# Patient Record
Sex: Female | Born: 1950 | Race: White | Hispanic: No | Marital: Married
Health system: Southern US, Community
[De-identification: ages and names within clinical notes are randomized; demographics above are authoritative.]

---

## 1999-01-05 ENCOUNTER — Other Ambulatory Visit: Admission: RE | Admit: 1999-01-05 | Discharge: 1999-01-05 | Payer: Self-pay | Admitting: Family Medicine

## 2000-02-19 ENCOUNTER — Encounter: Admission: RE | Admit: 2000-02-19 | Discharge: 2000-02-19 | Payer: Self-pay | Admitting: Family Medicine

## 2000-02-19 ENCOUNTER — Encounter: Payer: Self-pay | Admitting: Family Medicine

## 2001-02-04 ENCOUNTER — Other Ambulatory Visit: Admission: RE | Admit: 2001-02-04 | Discharge: 2001-02-04 | Payer: Self-pay | Admitting: Family Medicine

## 2001-06-30 ENCOUNTER — Encounter: Admission: RE | Admit: 2001-06-30 | Discharge: 2001-06-30 | Payer: Self-pay | Admitting: Family Medicine

## 2001-06-30 ENCOUNTER — Encounter: Payer: Self-pay | Admitting: Family Medicine

## 2002-07-27 ENCOUNTER — Other Ambulatory Visit: Admission: RE | Admit: 2002-07-27 | Discharge: 2002-07-27 | Payer: Self-pay | Admitting: Family Medicine

## 2003-07-29 ENCOUNTER — Other Ambulatory Visit: Admission: RE | Admit: 2003-07-29 | Discharge: 2003-07-29 | Payer: Self-pay | Admitting: Family Medicine

## 2004-01-03 ENCOUNTER — Encounter: Admission: RE | Admit: 2004-01-03 | Discharge: 2004-01-03 | Payer: Self-pay | Admitting: Family Medicine

## 2005-01-22 ENCOUNTER — Other Ambulatory Visit: Admission: RE | Admit: 2005-01-22 | Discharge: 2005-01-22 | Payer: Self-pay | Admitting: Family Medicine

## 2006-04-23 ENCOUNTER — Other Ambulatory Visit: Admission: RE | Admit: 2006-04-23 | Discharge: 2006-04-23 | Payer: Self-pay | Admitting: Family Medicine

## 2006-10-14 ENCOUNTER — Encounter: Admission: RE | Admit: 2006-10-14 | Discharge: 2006-10-14 | Payer: Self-pay | Admitting: Family Medicine

## 2006-10-23 ENCOUNTER — Encounter: Admission: RE | Admit: 2006-10-23 | Discharge: 2006-10-23 | Payer: Self-pay | Admitting: Family Medicine

## 2011-01-20 ENCOUNTER — Encounter: Payer: Self-pay | Admitting: Family Medicine

## 2013-06-10 ENCOUNTER — Other Ambulatory Visit: Payer: Self-pay

## 2013-06-10 DIAGNOSIS — Z1231 Encounter for screening mammogram for malignant neoplasm of breast: Secondary | ICD-10-CM

## 2013-07-08 ENCOUNTER — Ambulatory Visit
Admission: RE | Admit: 2013-07-08 | Discharge: 2013-07-08 | Disposition: A | Discharge status: Home / Self Care | Payer: BC Managed Care – PPO | Source: Ambulatory Visit

## 2013-07-08 DIAGNOSIS — Z1231 Encounter for screening mammogram for malignant neoplasm of breast: Secondary | ICD-10-CM

## 2014-07-21 ENCOUNTER — Other Ambulatory Visit: Payer: Self-pay

## 2014-07-21 DIAGNOSIS — Z1231 Encounter for screening mammogram for malignant neoplasm of breast: Secondary | ICD-10-CM

## 2014-08-05 ENCOUNTER — Ambulatory Visit
Admission: RE | Admit: 2014-08-05 | Discharge: 2014-08-05 | Disposition: A | Discharge status: Home / Self Care | Payer: BC Managed Care – PPO | Source: Ambulatory Visit

## 2014-08-05 DIAGNOSIS — Z1231 Encounter for screening mammogram for malignant neoplasm of breast: Secondary | ICD-10-CM

## 2014-10-12 DIAGNOSIS — Z23 Encounter for immunization: Secondary | ICD-10-CM | POA: Diagnosis not present

## 2014-10-12 DIAGNOSIS — E785 Hyperlipidemia, unspecified: Secondary | ICD-10-CM | POA: Diagnosis not present

## 2014-10-12 DIAGNOSIS — E119 Type 2 diabetes mellitus without complications: Secondary | ICD-10-CM | POA: Diagnosis not present

## 2014-10-12 DIAGNOSIS — I10 Essential (primary) hypertension: Secondary | ICD-10-CM | POA: Diagnosis not present

## 2014-10-19 DIAGNOSIS — Z6829 Body mass index (BMI) 29.0-29.9, adult: Secondary | ICD-10-CM | POA: Diagnosis not present

## 2014-10-19 DIAGNOSIS — I10 Essential (primary) hypertension: Secondary | ICD-10-CM | POA: Diagnosis not present

## 2014-10-19 DIAGNOSIS — E785 Hyperlipidemia, unspecified: Secondary | ICD-10-CM | POA: Diagnosis not present

## 2014-10-19 DIAGNOSIS — E119 Type 2 diabetes mellitus without complications: Secondary | ICD-10-CM | POA: Diagnosis not present

## 2015-02-11 DIAGNOSIS — I10 Essential (primary) hypertension: Secondary | ICD-10-CM | POA: Diagnosis not present

## 2015-02-11 DIAGNOSIS — E785 Hyperlipidemia, unspecified: Secondary | ICD-10-CM | POA: Diagnosis not present

## 2015-02-11 DIAGNOSIS — E119 Type 2 diabetes mellitus without complications: Secondary | ICD-10-CM | POA: Diagnosis not present

## 2015-02-18 DIAGNOSIS — E119 Type 2 diabetes mellitus without complications: Secondary | ICD-10-CM | POA: Diagnosis not present

## 2015-02-18 DIAGNOSIS — E1169 Type 2 diabetes mellitus with other specified complication: Secondary | ICD-10-CM | POA: Diagnosis not present

## 2015-02-18 DIAGNOSIS — I10 Essential (primary) hypertension: Secondary | ICD-10-CM | POA: Diagnosis not present

## 2015-02-18 DIAGNOSIS — E785 Hyperlipidemia, unspecified: Secondary | ICD-10-CM | POA: Diagnosis not present

## 2015-03-05 DIAGNOSIS — Z681 Body mass index (BMI) 19 or less, adult: Secondary | ICD-10-CM | POA: Diagnosis not present

## 2015-03-05 DIAGNOSIS — Z87891 Personal history of nicotine dependence: Secondary | ICD-10-CM | POA: Diagnosis not present

## 2015-03-05 DIAGNOSIS — S81851A Open bite, right lower leg, initial encounter: Secondary | ICD-10-CM | POA: Diagnosis not present

## 2015-03-05 DIAGNOSIS — I10 Essential (primary) hypertension: Secondary | ICD-10-CM | POA: Diagnosis not present

## 2015-03-05 DIAGNOSIS — J449 Chronic obstructive pulmonary disease, unspecified: Secondary | ICD-10-CM | POA: Diagnosis not present

## 2015-03-05 DIAGNOSIS — Z23 Encounter for immunization: Secondary | ICD-10-CM | POA: Diagnosis not present

## 2015-03-05 DIAGNOSIS — F419 Anxiety disorder, unspecified: Secondary | ICD-10-CM | POA: Diagnosis not present

## 2015-03-05 DIAGNOSIS — W540XXA Bitten by dog, initial encounter: Secondary | ICD-10-CM | POA: Diagnosis not present

## 2015-03-05 DIAGNOSIS — S81852A Open bite, left lower leg, initial encounter: Secondary | ICD-10-CM | POA: Diagnosis not present

## 2015-03-09 DIAGNOSIS — Z6825 Body mass index (BMI) 25.0-25.9, adult: Secondary | ICD-10-CM | POA: Diagnosis not present

## 2015-03-09 DIAGNOSIS — W540XXA Bitten by dog, initial encounter: Secondary | ICD-10-CM | POA: Diagnosis not present

## 2015-03-09 DIAGNOSIS — S81851A Open bite, right lower leg, initial encounter: Secondary | ICD-10-CM | POA: Diagnosis not present

## 2015-03-09 DIAGNOSIS — S81852A Open bite, left lower leg, initial encounter: Secondary | ICD-10-CM | POA: Diagnosis not present

## 2015-03-23 DIAGNOSIS — Z6825 Body mass index (BMI) 25.0-25.9, adult: Secondary | ICD-10-CM | POA: Diagnosis not present

## 2015-03-23 DIAGNOSIS — W540XXD Bitten by dog, subsequent encounter: Secondary | ICD-10-CM | POA: Diagnosis not present

## 2015-03-23 DIAGNOSIS — S81852D Open bite, left lower leg, subsequent encounter: Secondary | ICD-10-CM | POA: Diagnosis not present

## 2015-03-23 DIAGNOSIS — S81851D Open bite, right lower leg, subsequent encounter: Secondary | ICD-10-CM | POA: Diagnosis not present

## 2015-03-30 DIAGNOSIS — Z6829 Body mass index (BMI) 29.0-29.9, adult: Secondary | ICD-10-CM | POA: Diagnosis not present

## 2015-03-30 DIAGNOSIS — W540XXD Bitten by dog, subsequent encounter: Secondary | ICD-10-CM | POA: Diagnosis not present

## 2015-03-30 DIAGNOSIS — S81852D Open bite, left lower leg, subsequent encounter: Secondary | ICD-10-CM | POA: Diagnosis not present

## 2015-03-30 DIAGNOSIS — S81851D Open bite, right lower leg, subsequent encounter: Secondary | ICD-10-CM | POA: Diagnosis not present

## 2015-04-27 DIAGNOSIS — Z09 Encounter for follow-up examination after completed treatment for conditions other than malignant neoplasm: Secondary | ICD-10-CM | POA: Diagnosis not present

## 2015-04-27 DIAGNOSIS — S81852D Open bite, left lower leg, subsequent encounter: Secondary | ICD-10-CM | POA: Diagnosis not present

## 2015-04-27 DIAGNOSIS — W540XXD Bitten by dog, subsequent encounter: Secondary | ICD-10-CM | POA: Diagnosis not present

## 2015-05-18 DIAGNOSIS — W540XXA Bitten by dog, initial encounter: Secondary | ICD-10-CM | POA: Diagnosis not present

## 2015-05-18 DIAGNOSIS — S81851A Open bite, right lower leg, initial encounter: Secondary | ICD-10-CM | POA: Diagnosis not present

## 2015-05-18 DIAGNOSIS — Z6825 Body mass index (BMI) 25.0-25.9, adult: Secondary | ICD-10-CM | POA: Diagnosis not present

## 2015-06-13 DIAGNOSIS — H2510 Age-related nuclear cataract, unspecified eye: Secondary | ICD-10-CM | POA: Diagnosis not present

## 2015-06-15 DIAGNOSIS — E1169 Type 2 diabetes mellitus with other specified complication: Secondary | ICD-10-CM | POA: Diagnosis not present

## 2015-06-15 DIAGNOSIS — E119 Type 2 diabetes mellitus without complications: Secondary | ICD-10-CM | POA: Diagnosis not present

## 2015-06-15 DIAGNOSIS — I10 Essential (primary) hypertension: Secondary | ICD-10-CM | POA: Diagnosis not present

## 2015-06-22 DIAGNOSIS — E1169 Type 2 diabetes mellitus with other specified complication: Secondary | ICD-10-CM | POA: Diagnosis not present

## 2015-06-22 DIAGNOSIS — E1159 Type 2 diabetes mellitus with other circulatory complications: Secondary | ICD-10-CM | POA: Diagnosis not present

## 2015-06-22 DIAGNOSIS — E785 Hyperlipidemia, unspecified: Secondary | ICD-10-CM | POA: Diagnosis not present

## 2015-06-22 DIAGNOSIS — E119 Type 2 diabetes mellitus without complications: Secondary | ICD-10-CM | POA: Diagnosis not present

## 2015-09-21 DIAGNOSIS — Z23 Encounter for immunization: Secondary | ICD-10-CM | POA: Diagnosis not present

## 2015-09-21 DIAGNOSIS — Z6831 Body mass index (BMI) 31.0-31.9, adult: Secondary | ICD-10-CM | POA: Diagnosis not present

## 2015-09-21 DIAGNOSIS — Z1211 Encounter for screening for malignant neoplasm of colon: Secondary | ICD-10-CM | POA: Diagnosis not present

## 2015-09-21 DIAGNOSIS — Z0000 Encounter for general adult medical examination without abnormal findings: Secondary | ICD-10-CM | POA: Diagnosis not present

## 2015-09-26 ENCOUNTER — Other Ambulatory Visit: Payer: Self-pay

## 2015-09-26 DIAGNOSIS — Z1231 Encounter for screening mammogram for malignant neoplasm of breast: Secondary | ICD-10-CM

## 2015-10-13 DIAGNOSIS — E119 Type 2 diabetes mellitus without complications: Secondary | ICD-10-CM | POA: Diagnosis not present

## 2015-10-13 DIAGNOSIS — E1169 Type 2 diabetes mellitus with other specified complication: Secondary | ICD-10-CM | POA: Diagnosis not present

## 2015-10-13 DIAGNOSIS — E1159 Type 2 diabetes mellitus with other circulatory complications: Secondary | ICD-10-CM | POA: Diagnosis not present

## 2015-10-20 DIAGNOSIS — I10 Essential (primary) hypertension: Secondary | ICD-10-CM | POA: Diagnosis not present

## 2015-10-20 DIAGNOSIS — E1159 Type 2 diabetes mellitus with other circulatory complications: Secondary | ICD-10-CM | POA: Diagnosis not present

## 2015-10-20 DIAGNOSIS — Z23 Encounter for immunization: Secondary | ICD-10-CM | POA: Diagnosis not present

## 2015-10-20 DIAGNOSIS — E119 Type 2 diabetes mellitus without complications: Secondary | ICD-10-CM | POA: Diagnosis not present

## 2015-10-20 DIAGNOSIS — E1169 Type 2 diabetes mellitus with other specified complication: Secondary | ICD-10-CM | POA: Diagnosis not present

## 2015-10-28 ENCOUNTER — Ambulatory Visit
Admission: RE | Admit: 2015-10-28 | Discharge: 2015-10-28 | Disposition: A | Discharge status: Home / Self Care | Payer: Medicare Other | Source: Ambulatory Visit

## 2015-10-28 DIAGNOSIS — Z1231 Encounter for screening mammogram for malignant neoplasm of breast: Secondary | ICD-10-CM

## 2016-01-26 DIAGNOSIS — L309 Dermatitis, unspecified: Secondary | ICD-10-CM | POA: Diagnosis not present

## 2016-01-26 DIAGNOSIS — Z6831 Body mass index (BMI) 31.0-31.9, adult: Secondary | ICD-10-CM | POA: Diagnosis not present

## 2016-02-01 DIAGNOSIS — E785 Hyperlipidemia, unspecified: Secondary | ICD-10-CM | POA: Diagnosis not present

## 2016-02-01 DIAGNOSIS — E1169 Type 2 diabetes mellitus with other specified complication: Secondary | ICD-10-CM | POA: Diagnosis not present

## 2016-02-01 DIAGNOSIS — I10 Essential (primary) hypertension: Secondary | ICD-10-CM | POA: Diagnosis not present

## 2016-02-08 DIAGNOSIS — E1159 Type 2 diabetes mellitus with other circulatory complications: Secondary | ICD-10-CM | POA: Diagnosis not present

## 2016-02-08 DIAGNOSIS — E119 Type 2 diabetes mellitus without complications: Secondary | ICD-10-CM | POA: Diagnosis not present

## 2016-02-08 DIAGNOSIS — E782 Mixed hyperlipidemia: Secondary | ICD-10-CM | POA: Diagnosis not present

## 2016-02-08 DIAGNOSIS — I10 Essential (primary) hypertension: Secondary | ICD-10-CM | POA: Diagnosis not present

## 2016-06-12 DIAGNOSIS — E119 Type 2 diabetes mellitus without complications: Secondary | ICD-10-CM | POA: Diagnosis not present

## 2016-06-12 DIAGNOSIS — E782 Mixed hyperlipidemia: Secondary | ICD-10-CM | POA: Diagnosis not present

## 2016-06-12 DIAGNOSIS — E1159 Type 2 diabetes mellitus with other circulatory complications: Secondary | ICD-10-CM | POA: Diagnosis not present

## 2016-06-19 DIAGNOSIS — E1159 Type 2 diabetes mellitus with other circulatory complications: Secondary | ICD-10-CM | POA: Diagnosis not present

## 2016-06-19 DIAGNOSIS — E782 Mixed hyperlipidemia: Secondary | ICD-10-CM | POA: Diagnosis not present

## 2016-06-19 DIAGNOSIS — E119 Type 2 diabetes mellitus without complications: Secondary | ICD-10-CM | POA: Diagnosis not present

## 2016-06-19 DIAGNOSIS — I10 Essential (primary) hypertension: Secondary | ICD-10-CM | POA: Diagnosis not present

## 2016-07-13 DIAGNOSIS — E119 Type 2 diabetes mellitus without complications: Secondary | ICD-10-CM | POA: Diagnosis not present

## 2016-07-13 DIAGNOSIS — H2510 Age-related nuclear cataract, unspecified eye: Secondary | ICD-10-CM | POA: Diagnosis not present

## 2016-09-25 DIAGNOSIS — Z0000 Encounter for general adult medical examination without abnormal findings: Secondary | ICD-10-CM | POA: Diagnosis not present

## 2016-09-25 DIAGNOSIS — Z139 Encounter for screening, unspecified: Secondary | ICD-10-CM | POA: Diagnosis not present

## 2016-09-25 DIAGNOSIS — Z1389 Encounter for screening for other disorder: Secondary | ICD-10-CM | POA: Diagnosis not present

## 2016-10-11 DIAGNOSIS — R05 Cough: Secondary | ICD-10-CM | POA: Diagnosis not present

## 2016-10-11 DIAGNOSIS — J029 Acute pharyngitis, unspecified: Secondary | ICD-10-CM | POA: Diagnosis not present

## 2016-10-11 DIAGNOSIS — Z6831 Body mass index (BMI) 31.0-31.9, adult: Secondary | ICD-10-CM | POA: Diagnosis not present

## 2016-10-11 DIAGNOSIS — J019 Acute sinusitis, unspecified: Secondary | ICD-10-CM | POA: Diagnosis not present

## 2016-10-23 DIAGNOSIS — E119 Type 2 diabetes mellitus without complications: Secondary | ICD-10-CM | POA: Diagnosis not present

## 2016-10-23 DIAGNOSIS — E1159 Type 2 diabetes mellitus with other circulatory complications: Secondary | ICD-10-CM | POA: Diagnosis not present

## 2016-10-23 DIAGNOSIS — E782 Mixed hyperlipidemia: Secondary | ICD-10-CM | POA: Diagnosis not present

## 2016-10-30 DIAGNOSIS — Z23 Encounter for immunization: Secondary | ICD-10-CM | POA: Diagnosis not present

## 2016-10-30 DIAGNOSIS — E782 Mixed hyperlipidemia: Secondary | ICD-10-CM | POA: Diagnosis not present

## 2016-10-30 DIAGNOSIS — I10 Essential (primary) hypertension: Secondary | ICD-10-CM | POA: Diagnosis not present

## 2016-10-30 DIAGNOSIS — E119 Type 2 diabetes mellitus without complications: Secondary | ICD-10-CM | POA: Diagnosis not present

## 2016-10-30 DIAGNOSIS — E1159 Type 2 diabetes mellitus with other circulatory complications: Secondary | ICD-10-CM | POA: Diagnosis not present

## 2016-11-02 DIAGNOSIS — Z23 Encounter for immunization: Secondary | ICD-10-CM | POA: Diagnosis not present

## 2016-11-20 DIAGNOSIS — I10 Essential (primary) hypertension: Secondary | ICD-10-CM | POA: Diagnosis not present

## 2016-11-20 DIAGNOSIS — Z6831 Body mass index (BMI) 31.0-31.9, adult: Secondary | ICD-10-CM | POA: Diagnosis not present

## 2016-11-20 DIAGNOSIS — E1159 Type 2 diabetes mellitus with other circulatory complications: Secondary | ICD-10-CM | POA: Diagnosis not present

## 2016-11-28 ENCOUNTER — Other Ambulatory Visit: Payer: Self-pay | Admitting: Family Medicine

## 2016-11-28 DIAGNOSIS — Z1231 Encounter for screening mammogram for malignant neoplasm of breast: Secondary | ICD-10-CM

## 2016-12-21 ENCOUNTER — Ambulatory Visit
Admission: RE | Admit: 2016-12-21 | Discharge: 2016-12-21 | Disposition: A | Discharge status: Home / Self Care | Payer: Medicare Other | Source: Ambulatory Visit | Attending: Family Medicine | Admitting: Family Medicine

## 2016-12-21 DIAGNOSIS — Z1231 Encounter for screening mammogram for malignant neoplasm of breast: Secondary | ICD-10-CM

## 2017-01-15 DIAGNOSIS — E119 Type 2 diabetes mellitus without complications: Secondary | ICD-10-CM | POA: Diagnosis not present

## 2017-01-15 DIAGNOSIS — E782 Mixed hyperlipidemia: Secondary | ICD-10-CM | POA: Diagnosis not present

## 2017-01-15 DIAGNOSIS — E1159 Type 2 diabetes mellitus with other circulatory complications: Secondary | ICD-10-CM | POA: Diagnosis not present

## 2017-01-28 DIAGNOSIS — I10 Essential (primary) hypertension: Secondary | ICD-10-CM | POA: Diagnosis not present

## 2017-01-28 DIAGNOSIS — E119 Type 2 diabetes mellitus without complications: Secondary | ICD-10-CM | POA: Diagnosis not present

## 2017-01-28 DIAGNOSIS — E782 Mixed hyperlipidemia: Secondary | ICD-10-CM | POA: Diagnosis not present

## 2017-01-28 DIAGNOSIS — E1159 Type 2 diabetes mellitus with other circulatory complications: Secondary | ICD-10-CM | POA: Diagnosis not present

## 2017-04-30 DIAGNOSIS — M546 Pain in thoracic spine: Secondary | ICD-10-CM | POA: Diagnosis not present

## 2017-04-30 DIAGNOSIS — Z6832 Body mass index (BMI) 32.0-32.9, adult: Secondary | ICD-10-CM | POA: Diagnosis not present

## 2017-05-20 DIAGNOSIS — E119 Type 2 diabetes mellitus without complications: Secondary | ICD-10-CM | POA: Diagnosis not present

## 2017-05-20 DIAGNOSIS — E1159 Type 2 diabetes mellitus with other circulatory complications: Secondary | ICD-10-CM | POA: Diagnosis not present

## 2017-05-20 DIAGNOSIS — E782 Mixed hyperlipidemia: Secondary | ICD-10-CM | POA: Diagnosis not present

## 2017-06-03 DIAGNOSIS — I10 Essential (primary) hypertension: Secondary | ICD-10-CM | POA: Diagnosis not present

## 2017-06-03 DIAGNOSIS — E119 Type 2 diabetes mellitus without complications: Secondary | ICD-10-CM | POA: Diagnosis not present

## 2017-06-03 DIAGNOSIS — E1159 Type 2 diabetes mellitus with other circulatory complications: Secondary | ICD-10-CM | POA: Diagnosis not present

## 2017-06-03 DIAGNOSIS — E782 Mixed hyperlipidemia: Secondary | ICD-10-CM | POA: Diagnosis not present

## 2017-07-01 DIAGNOSIS — B353 Tinea pedis: Secondary | ICD-10-CM | POA: Diagnosis not present

## 2017-07-01 DIAGNOSIS — M2011 Hallux valgus (acquired), right foot: Secondary | ICD-10-CM | POA: Diagnosis not present

## 2017-09-11 DIAGNOSIS — L298 Other pruritus: Secondary | ICD-10-CM | POA: Diagnosis not present

## 2017-09-11 DIAGNOSIS — M79609 Pain in unspecified limb: Secondary | ICD-10-CM | POA: Diagnosis not present

## 2017-09-11 DIAGNOSIS — Z6832 Body mass index (BMI) 32.0-32.9, adult: Secondary | ICD-10-CM | POA: Diagnosis not present

## 2017-09-11 DIAGNOSIS — Z139 Encounter for screening, unspecified: Secondary | ICD-10-CM | POA: Diagnosis not present

## 2017-09-11 DIAGNOSIS — F3160 Bipolar disorder, current episode mixed, unspecified: Secondary | ICD-10-CM | POA: Diagnosis not present

## 2017-09-11 DIAGNOSIS — Z1379 Encounter for other screening for genetic and chromosomal anomalies: Secondary | ICD-10-CM | POA: Diagnosis not present

## 2017-11-05 DIAGNOSIS — E119 Type 2 diabetes mellitus without complications: Secondary | ICD-10-CM | POA: Diagnosis not present

## 2017-11-05 DIAGNOSIS — E1159 Type 2 diabetes mellitus with other circulatory complications: Secondary | ICD-10-CM | POA: Diagnosis not present

## 2017-11-05 DIAGNOSIS — E782 Mixed hyperlipidemia: Secondary | ICD-10-CM | POA: Diagnosis not present

## 2017-11-18 DIAGNOSIS — E782 Mixed hyperlipidemia: Secondary | ICD-10-CM | POA: Diagnosis not present

## 2017-11-18 DIAGNOSIS — I10 Essential (primary) hypertension: Secondary | ICD-10-CM | POA: Diagnosis not present

## 2017-11-18 DIAGNOSIS — E1159 Type 2 diabetes mellitus with other circulatory complications: Secondary | ICD-10-CM | POA: Diagnosis not present

## 2017-11-18 DIAGNOSIS — E119 Type 2 diabetes mellitus without complications: Secondary | ICD-10-CM | POA: Diagnosis not present

## 2017-11-18 DIAGNOSIS — Z23 Encounter for immunization: Secondary | ICD-10-CM | POA: Diagnosis not present

## 2017-11-27 ENCOUNTER — Other Ambulatory Visit: Payer: Self-pay | Admitting: Family Medicine

## 2017-11-27 DIAGNOSIS — Z1231 Encounter for screening mammogram for malignant neoplasm of breast: Secondary | ICD-10-CM

## 2017-12-03 DIAGNOSIS — Z7189 Other specified counseling: Secondary | ICD-10-CM | POA: Diagnosis not present

## 2017-12-03 DIAGNOSIS — Z0000 Encounter for general adult medical examination without abnormal findings: Secondary | ICD-10-CM | POA: Diagnosis not present

## 2017-12-03 DIAGNOSIS — Z139 Encounter for screening, unspecified: Secondary | ICD-10-CM | POA: Diagnosis not present

## 2017-12-03 DIAGNOSIS — Z01419 Encounter for gynecological examination (general) (routine) without abnormal findings: Secondary | ICD-10-CM | POA: Diagnosis not present

## 2017-12-03 DIAGNOSIS — Z1211 Encounter for screening for malignant neoplasm of colon: Secondary | ICD-10-CM | POA: Diagnosis not present

## 2017-12-03 DIAGNOSIS — Z1331 Encounter for screening for depression: Secondary | ICD-10-CM | POA: Diagnosis not present

## 2017-12-11 DIAGNOSIS — J441 Chronic obstructive pulmonary disease with (acute) exacerbation: Secondary | ICD-10-CM | POA: Diagnosis not present

## 2017-12-11 DIAGNOSIS — Z6832 Body mass index (BMI) 32.0-32.9, adult: Secondary | ICD-10-CM | POA: Diagnosis not present

## 2017-12-11 DIAGNOSIS — R05 Cough: Secondary | ICD-10-CM | POA: Diagnosis not present

## 2017-12-13 DIAGNOSIS — E119 Type 2 diabetes mellitus without complications: Secondary | ICD-10-CM | POA: Diagnosis not present

## 2017-12-13 DIAGNOSIS — H2510 Age-related nuclear cataract, unspecified eye: Secondary | ICD-10-CM | POA: Diagnosis not present

## 2017-12-13 DIAGNOSIS — H04123 Dry eye syndrome of bilateral lacrimal glands: Secondary | ICD-10-CM | POA: Diagnosis not present

## 2017-12-25 ENCOUNTER — Ambulatory Visit
Admission: RE | Admit: 2017-12-25 | Discharge: 2017-12-25 | Disposition: A | Discharge status: Home / Self Care | Payer: Medicare Other | Source: Ambulatory Visit | Attending: Family Medicine | Admitting: Family Medicine

## 2017-12-25 DIAGNOSIS — Z1231 Encounter for screening mammogram for malignant neoplasm of breast: Secondary | ICD-10-CM | POA: Diagnosis not present

## 2018-02-11 DIAGNOSIS — R6889 Other general symptoms and signs: Secondary | ICD-10-CM | POA: Diagnosis not present

## 2018-02-11 DIAGNOSIS — Z6831 Body mass index (BMI) 31.0-31.9, adult: Secondary | ICD-10-CM | POA: Diagnosis not present

## 2018-02-11 DIAGNOSIS — J320 Chronic maxillary sinusitis: Secondary | ICD-10-CM | POA: Diagnosis not present

## 2018-03-18 DIAGNOSIS — E782 Mixed hyperlipidemia: Secondary | ICD-10-CM | POA: Diagnosis not present

## 2018-03-18 DIAGNOSIS — E119 Type 2 diabetes mellitus without complications: Secondary | ICD-10-CM | POA: Diagnosis not present

## 2018-03-18 DIAGNOSIS — E1159 Type 2 diabetes mellitus with other circulatory complications: Secondary | ICD-10-CM | POA: Diagnosis not present

## 2018-03-24 DIAGNOSIS — E1159 Type 2 diabetes mellitus with other circulatory complications: Secondary | ICD-10-CM | POA: Diagnosis not present

## 2018-03-24 DIAGNOSIS — E782 Mixed hyperlipidemia: Secondary | ICD-10-CM | POA: Diagnosis not present

## 2018-03-24 DIAGNOSIS — E119 Type 2 diabetes mellitus without complications: Secondary | ICD-10-CM | POA: Diagnosis not present

## 2018-03-24 DIAGNOSIS — I10 Essential (primary) hypertension: Secondary | ICD-10-CM | POA: Diagnosis not present

## 2018-05-26 DIAGNOSIS — J209 Acute bronchitis, unspecified: Secondary | ICD-10-CM | POA: Diagnosis not present

## 2018-07-17 DIAGNOSIS — E782 Mixed hyperlipidemia: Secondary | ICD-10-CM | POA: Diagnosis not present

## 2018-07-17 DIAGNOSIS — E119 Type 2 diabetes mellitus without complications: Secondary | ICD-10-CM | POA: Diagnosis not present

## 2018-07-17 DIAGNOSIS — E1159 Type 2 diabetes mellitus with other circulatory complications: Secondary | ICD-10-CM | POA: Diagnosis not present

## 2018-07-25 DIAGNOSIS — E1159 Type 2 diabetes mellitus with other circulatory complications: Secondary | ICD-10-CM | POA: Diagnosis not present

## 2018-07-25 DIAGNOSIS — I10 Essential (primary) hypertension: Secondary | ICD-10-CM | POA: Diagnosis not present

## 2018-07-25 DIAGNOSIS — E119 Type 2 diabetes mellitus without complications: Secondary | ICD-10-CM | POA: Diagnosis not present

## 2018-07-25 DIAGNOSIS — E782 Mixed hyperlipidemia: Secondary | ICD-10-CM | POA: Diagnosis not present

## 2018-11-18 ENCOUNTER — Other Ambulatory Visit: Payer: Self-pay

## 2018-11-25 DIAGNOSIS — E119 Type 2 diabetes mellitus without complications: Secondary | ICD-10-CM | POA: Diagnosis not present

## 2018-11-25 DIAGNOSIS — E1159 Type 2 diabetes mellitus with other circulatory complications: Secondary | ICD-10-CM | POA: Diagnosis not present

## 2018-11-25 DIAGNOSIS — E782 Mixed hyperlipidemia: Secondary | ICD-10-CM | POA: Diagnosis not present

## 2018-12-03 DIAGNOSIS — E119 Type 2 diabetes mellitus without complications: Secondary | ICD-10-CM | POA: Diagnosis not present

## 2018-12-03 DIAGNOSIS — E785 Hyperlipidemia, unspecified: Secondary | ICD-10-CM | POA: Diagnosis not present

## 2018-12-03 DIAGNOSIS — E1159 Type 2 diabetes mellitus with other circulatory complications: Secondary | ICD-10-CM | POA: Diagnosis not present

## 2018-12-03 DIAGNOSIS — Z1331 Encounter for screening for depression: Secondary | ICD-10-CM | POA: Diagnosis not present

## 2018-12-03 DIAGNOSIS — Z0000 Encounter for general adult medical examination without abnormal findings: Secondary | ICD-10-CM | POA: Diagnosis not present

## 2018-12-03 DIAGNOSIS — E1169 Type 2 diabetes mellitus with other specified complication: Secondary | ICD-10-CM | POA: Diagnosis not present

## 2018-12-17 ENCOUNTER — Other Ambulatory Visit: Payer: Self-pay | Admitting: Family Medicine

## 2018-12-17 DIAGNOSIS — Z1231 Encounter for screening mammogram for malignant neoplasm of breast: Secondary | ICD-10-CM

## 2018-12-18 DIAGNOSIS — L299 Pruritus, unspecified: Secondary | ICD-10-CM | POA: Diagnosis not present

## 2018-12-18 DIAGNOSIS — L301 Dyshidrosis [pompholyx]: Secondary | ICD-10-CM | POA: Diagnosis not present

## 2019-01-08 DIAGNOSIS — L300 Nummular dermatitis: Secondary | ICD-10-CM | POA: Diagnosis not present

## 2019-01-13 DIAGNOSIS — H2510 Age-related nuclear cataract, unspecified eye: Secondary | ICD-10-CM | POA: Diagnosis not present

## 2019-01-13 DIAGNOSIS — E119 Type 2 diabetes mellitus without complications: Secondary | ICD-10-CM | POA: Diagnosis not present

## 2019-01-14 ENCOUNTER — Ambulatory Visit
Admission: RE | Admit: 2019-01-14 | Discharge: 2019-01-14 | Disposition: A | Discharge status: Home / Self Care | Payer: Medicare Other | Source: Ambulatory Visit | Attending: Family Medicine | Admitting: Family Medicine

## 2019-01-14 DIAGNOSIS — Z1231 Encounter for screening mammogram for malignant neoplasm of breast: Secondary | ICD-10-CM | POA: Diagnosis not present

## 2019-04-03 DIAGNOSIS — E119 Type 2 diabetes mellitus without complications: Secondary | ICD-10-CM | POA: Diagnosis not present

## 2019-04-03 DIAGNOSIS — E1169 Type 2 diabetes mellitus with other specified complication: Secondary | ICD-10-CM | POA: Diagnosis not present

## 2019-04-03 DIAGNOSIS — E1159 Type 2 diabetes mellitus with other circulatory complications: Secondary | ICD-10-CM | POA: Diagnosis not present

## 2019-04-10 DIAGNOSIS — E1169 Type 2 diabetes mellitus with other specified complication: Secondary | ICD-10-CM | POA: Diagnosis not present

## 2019-04-10 DIAGNOSIS — J449 Chronic obstructive pulmonary disease, unspecified: Secondary | ICD-10-CM | POA: Diagnosis not present

## 2019-04-10 DIAGNOSIS — J45909 Unspecified asthma, uncomplicated: Secondary | ICD-10-CM | POA: Diagnosis not present

## 2019-04-10 DIAGNOSIS — E119 Type 2 diabetes mellitus without complications: Secondary | ICD-10-CM | POA: Diagnosis not present

## 2019-04-22 DIAGNOSIS — T7840XA Allergy, unspecified, initial encounter: Secondary | ICD-10-CM | POA: Diagnosis not present

## 2019-04-22 DIAGNOSIS — T783XXA Angioneurotic edema, initial encounter: Secondary | ICD-10-CM | POA: Diagnosis not present

## 2019-04-22 DIAGNOSIS — I10 Essential (primary) hypertension: Secondary | ICD-10-CM | POA: Diagnosis not present

## 2019-04-22 DIAGNOSIS — F419 Anxiety disorder, unspecified: Secondary | ICD-10-CM | POA: Diagnosis not present

## 2019-04-22 DIAGNOSIS — Z87891 Personal history of nicotine dependence: Secondary | ICD-10-CM | POA: Diagnosis not present

## 2019-04-22 DIAGNOSIS — J449 Chronic obstructive pulmonary disease, unspecified: Secondary | ICD-10-CM | POA: Diagnosis not present

## 2019-04-27 DIAGNOSIS — E1159 Type 2 diabetes mellitus with other circulatory complications: Secondary | ICD-10-CM | POA: Diagnosis not present

## 2019-04-27 DIAGNOSIS — I10 Essential (primary) hypertension: Secondary | ICD-10-CM | POA: Diagnosis not present

## 2019-04-27 DIAGNOSIS — Z888 Allergy status to other drugs, medicaments and biological substances status: Secondary | ICD-10-CM | POA: Diagnosis not present

## 2019-05-18 DIAGNOSIS — L400 Psoriasis vulgaris: Secondary | ICD-10-CM | POA: Diagnosis not present

## 2019-05-18 DIAGNOSIS — L299 Pruritus, unspecified: Secondary | ICD-10-CM | POA: Diagnosis not present

## 2019-05-27 DIAGNOSIS — E1159 Type 2 diabetes mellitus with other circulatory complications: Secondary | ICD-10-CM | POA: Diagnosis not present

## 2019-05-27 DIAGNOSIS — I10 Essential (primary) hypertension: Secondary | ICD-10-CM | POA: Diagnosis not present

## 2019-07-30 ENCOUNTER — Other Ambulatory Visit: Payer: Self-pay

## 2019-08-03 DIAGNOSIS — E782 Mixed hyperlipidemia: Secondary | ICD-10-CM | POA: Diagnosis not present

## 2019-08-03 DIAGNOSIS — E1159 Type 2 diabetes mellitus with other circulatory complications: Secondary | ICD-10-CM | POA: Diagnosis not present

## 2019-08-10 DIAGNOSIS — E119 Type 2 diabetes mellitus without complications: Secondary | ICD-10-CM | POA: Diagnosis not present

## 2019-08-10 DIAGNOSIS — J309 Allergic rhinitis, unspecified: Secondary | ICD-10-CM | POA: Diagnosis not present

## 2019-08-10 DIAGNOSIS — E1169 Type 2 diabetes mellitus with other specified complication: Secondary | ICD-10-CM | POA: Diagnosis not present

## 2019-08-10 DIAGNOSIS — E785 Hyperlipidemia, unspecified: Secondary | ICD-10-CM | POA: Diagnosis not present

## 2019-08-24 DIAGNOSIS — L299 Pruritus, unspecified: Secondary | ICD-10-CM | POA: Diagnosis not present

## 2019-08-24 DIAGNOSIS — L400 Psoriasis vulgaris: Secondary | ICD-10-CM | POA: Diagnosis not present

## 2019-11-20 ENCOUNTER — Other Ambulatory Visit: Payer: Self-pay

## 2019-11-23 DIAGNOSIS — L400 Psoriasis vulgaris: Secondary | ICD-10-CM | POA: Diagnosis not present

## 2019-12-04 DIAGNOSIS — E1169 Type 2 diabetes mellitus with other specified complication: Secondary | ICD-10-CM | POA: Diagnosis not present

## 2019-12-06 DIAGNOSIS — R509 Fever, unspecified: Secondary | ICD-10-CM | POA: Diagnosis not present

## 2019-12-06 DIAGNOSIS — Z20828 Contact with and (suspected) exposure to other viral communicable diseases: Secondary | ICD-10-CM | POA: Diagnosis not present

## 2019-12-06 DIAGNOSIS — R05 Cough: Secondary | ICD-10-CM | POA: Diagnosis not present

## 2019-12-06 DIAGNOSIS — R519 Headache, unspecified: Secondary | ICD-10-CM | POA: Diagnosis not present

## 2019-12-13 DIAGNOSIS — R079 Chest pain, unspecified: Secondary | ICD-10-CM | POA: Diagnosis not present

## 2019-12-29 ENCOUNTER — Other Ambulatory Visit: Payer: Self-pay | Admitting: Family Medicine

## 2019-12-29 DIAGNOSIS — Z1231 Encounter for screening mammogram for malignant neoplasm of breast: Secondary | ICD-10-CM

## 2020-01-22 DIAGNOSIS — L400 Psoriasis vulgaris: Secondary | ICD-10-CM | POA: Diagnosis not present

## 2020-01-22 DIAGNOSIS — L299 Pruritus, unspecified: Secondary | ICD-10-CM | POA: Diagnosis not present

## 2020-01-27 DIAGNOSIS — Z23 Encounter for immunization: Secondary | ICD-10-CM | POA: Diagnosis not present

## 2020-02-10 ENCOUNTER — Ambulatory Visit: Payer: Medicare Other

## 2020-02-24 DIAGNOSIS — Z23 Encounter for immunization: Secondary | ICD-10-CM | POA: Diagnosis not present

## 2020-02-26 DIAGNOSIS — Z6829 Body mass index (BMI) 29.0-29.9, adult: Secondary | ICD-10-CM | POA: Diagnosis not present

## 2020-02-26 DIAGNOSIS — L509 Urticaria, unspecified: Secondary | ICD-10-CM | POA: Diagnosis not present

## 2020-03-02 DIAGNOSIS — Z9109 Other allergy status, other than to drugs and biological substances: Secondary | ICD-10-CM | POA: Diagnosis not present

## 2020-03-02 DIAGNOSIS — Z7189 Other specified counseling: Secondary | ICD-10-CM | POA: Diagnosis not present

## 2020-03-02 DIAGNOSIS — Z0000 Encounter for general adult medical examination without abnormal findings: Secondary | ICD-10-CM | POA: Diagnosis not present

## 2020-03-02 DIAGNOSIS — Z139 Encounter for screening, unspecified: Secondary | ICD-10-CM | POA: Diagnosis not present

## 2020-03-02 DIAGNOSIS — Z1331 Encounter for screening for depression: Secondary | ICD-10-CM | POA: Diagnosis not present

## 2020-03-02 DIAGNOSIS — J309 Allergic rhinitis, unspecified: Secondary | ICD-10-CM | POA: Diagnosis not present

## 2020-03-02 DIAGNOSIS — Z1339 Encounter for screening examination for other mental health and behavioral disorders: Secondary | ICD-10-CM | POA: Diagnosis not present

## 2020-03-02 DIAGNOSIS — Z136 Encounter for screening for cardiovascular disorders: Secondary | ICD-10-CM | POA: Diagnosis not present

## 2020-03-03 DIAGNOSIS — Z9109 Other allergy status, other than to drugs and biological substances: Secondary | ICD-10-CM | POA: Diagnosis not present

## 2020-03-03 DIAGNOSIS — J309 Allergic rhinitis, unspecified: Secondary | ICD-10-CM | POA: Diagnosis not present

## 2020-03-03 DIAGNOSIS — Z91018 Allergy to other foods: Secondary | ICD-10-CM | POA: Diagnosis not present

## 2020-03-04 DIAGNOSIS — Z9109 Other allergy status, other than to drugs and biological substances: Secondary | ICD-10-CM | POA: Diagnosis not present

## 2020-03-04 DIAGNOSIS — Z91018 Allergy to other foods: Secondary | ICD-10-CM | POA: Diagnosis not present

## 2020-03-07 DIAGNOSIS — Z91018 Allergy to other foods: Secondary | ICD-10-CM | POA: Diagnosis not present

## 2020-03-07 DIAGNOSIS — Z9109 Other allergy status, other than to drugs and biological substances: Secondary | ICD-10-CM | POA: Diagnosis not present

## 2020-03-08 DIAGNOSIS — Z91018 Allergy to other foods: Secondary | ICD-10-CM | POA: Diagnosis not present

## 2020-03-08 DIAGNOSIS — Z9109 Other allergy status, other than to drugs and biological substances: Secondary | ICD-10-CM | POA: Diagnosis not present

## 2020-03-09 DIAGNOSIS — Z91018 Allergy to other foods: Secondary | ICD-10-CM | POA: Diagnosis not present

## 2020-03-09 DIAGNOSIS — Z6830 Body mass index (BMI) 30.0-30.9, adult: Secondary | ICD-10-CM | POA: Diagnosis not present

## 2020-03-09 DIAGNOSIS — Z91038 Other insect allergy status: Secondary | ICD-10-CM | POA: Diagnosis not present

## 2020-03-09 DIAGNOSIS — Z9109 Other allergy status, other than to drugs and biological substances: Secondary | ICD-10-CM | POA: Diagnosis not present

## 2020-03-10 DIAGNOSIS — Z9109 Other allergy status, other than to drugs and biological substances: Secondary | ICD-10-CM | POA: Diagnosis not present

## 2020-03-10 DIAGNOSIS — Z91018 Allergy to other foods: Secondary | ICD-10-CM | POA: Diagnosis not present

## 2020-03-11 DIAGNOSIS — Z91018 Allergy to other foods: Secondary | ICD-10-CM | POA: Diagnosis not present

## 2020-03-11 DIAGNOSIS — Z9109 Other allergy status, other than to drugs and biological substances: Secondary | ICD-10-CM | POA: Diagnosis not present

## 2020-03-17 ENCOUNTER — Ambulatory Visit
Admission: RE | Admit: 2020-03-17 | Discharge: 2020-03-17 | Disposition: A | Discharge status: Home / Self Care | Payer: Medicare Other | Source: Ambulatory Visit | Attending: Family Medicine | Admitting: Family Medicine

## 2020-03-17 ENCOUNTER — Other Ambulatory Visit: Payer: Self-pay

## 2020-03-17 DIAGNOSIS — Z1231 Encounter for screening mammogram for malignant neoplasm of breast: Secondary | ICD-10-CM | POA: Diagnosis not present

## 2020-03-21 DIAGNOSIS — L400 Psoriasis vulgaris: Secondary | ICD-10-CM | POA: Diagnosis not present

## 2020-04-11 DIAGNOSIS — E1169 Type 2 diabetes mellitus with other specified complication: Secondary | ICD-10-CM | POA: Diagnosis not present

## 2020-04-18 DIAGNOSIS — E785 Hyperlipidemia, unspecified: Secondary | ICD-10-CM | POA: Diagnosis not present

## 2020-04-18 DIAGNOSIS — E1169 Type 2 diabetes mellitus with other specified complication: Secondary | ICD-10-CM | POA: Diagnosis not present

## 2020-04-18 DIAGNOSIS — I152 Hypertension secondary to endocrine disorders: Secondary | ICD-10-CM | POA: Diagnosis not present

## 2020-04-18 DIAGNOSIS — E1159 Type 2 diabetes mellitus with other circulatory complications: Secondary | ICD-10-CM | POA: Diagnosis not present

## 2020-04-20 DIAGNOSIS — Z6830 Body mass index (BMI) 30.0-30.9, adult: Secondary | ICD-10-CM | POA: Diagnosis not present

## 2020-04-20 DIAGNOSIS — E1159 Type 2 diabetes mellitus with other circulatory complications: Secondary | ICD-10-CM | POA: Diagnosis not present

## 2020-04-20 DIAGNOSIS — I152 Hypertension secondary to endocrine disorders: Secondary | ICD-10-CM | POA: Diagnosis not present

## 2020-04-22 DIAGNOSIS — J309 Allergic rhinitis, unspecified: Secondary | ICD-10-CM | POA: Diagnosis not present

## 2020-04-26 DIAGNOSIS — J309 Allergic rhinitis, unspecified: Secondary | ICD-10-CM | POA: Diagnosis not present

## 2020-04-29 DIAGNOSIS — J309 Allergic rhinitis, unspecified: Secondary | ICD-10-CM | POA: Diagnosis not present

## 2020-05-03 DIAGNOSIS — J309 Allergic rhinitis, unspecified: Secondary | ICD-10-CM | POA: Diagnosis not present

## 2020-05-06 DIAGNOSIS — J309 Allergic rhinitis, unspecified: Secondary | ICD-10-CM | POA: Diagnosis not present

## 2020-05-10 DIAGNOSIS — J309 Allergic rhinitis, unspecified: Secondary | ICD-10-CM | POA: Diagnosis not present

## 2020-05-13 DIAGNOSIS — J309 Allergic rhinitis, unspecified: Secondary | ICD-10-CM | POA: Diagnosis not present

## 2020-05-17 DIAGNOSIS — J309 Allergic rhinitis, unspecified: Secondary | ICD-10-CM | POA: Diagnosis not present

## 2020-05-20 DIAGNOSIS — J309 Allergic rhinitis, unspecified: Secondary | ICD-10-CM | POA: Diagnosis not present

## 2020-05-24 DIAGNOSIS — J309 Allergic rhinitis, unspecified: Secondary | ICD-10-CM | POA: Diagnosis not present

## 2020-05-27 DIAGNOSIS — J309 Allergic rhinitis, unspecified: Secondary | ICD-10-CM | POA: Diagnosis not present

## 2020-05-31 DIAGNOSIS — J309 Allergic rhinitis, unspecified: Secondary | ICD-10-CM | POA: Diagnosis not present

## 2020-06-03 DIAGNOSIS — J309 Allergic rhinitis, unspecified: Secondary | ICD-10-CM | POA: Diagnosis not present

## 2020-06-07 DIAGNOSIS — J309 Allergic rhinitis, unspecified: Secondary | ICD-10-CM | POA: Diagnosis not present

## 2020-06-10 DIAGNOSIS — J309 Allergic rhinitis, unspecified: Secondary | ICD-10-CM | POA: Diagnosis not present

## 2020-06-14 DIAGNOSIS — J309 Allergic rhinitis, unspecified: Secondary | ICD-10-CM | POA: Diagnosis not present

## 2020-06-17 DIAGNOSIS — J309 Allergic rhinitis, unspecified: Secondary | ICD-10-CM | POA: Diagnosis not present

## 2020-07-18 DIAGNOSIS — J309 Allergic rhinitis, unspecified: Secondary | ICD-10-CM | POA: Diagnosis not present

## 2020-07-25 DIAGNOSIS — J309 Allergic rhinitis, unspecified: Secondary | ICD-10-CM | POA: Diagnosis not present

## 2020-08-05 DIAGNOSIS — J309 Allergic rhinitis, unspecified: Secondary | ICD-10-CM | POA: Diagnosis not present

## 2020-08-08 DIAGNOSIS — J309 Allergic rhinitis, unspecified: Secondary | ICD-10-CM | POA: Diagnosis not present

## 2020-08-10 DIAGNOSIS — E1169 Type 2 diabetes mellitus with other specified complication: Secondary | ICD-10-CM | POA: Diagnosis not present

## 2020-08-11 DIAGNOSIS — L400 Psoriasis vulgaris: Secondary | ICD-10-CM | POA: Diagnosis not present

## 2020-08-11 DIAGNOSIS — L299 Pruritus, unspecified: Secondary | ICD-10-CM | POA: Diagnosis not present

## 2020-08-15 DIAGNOSIS — J309 Allergic rhinitis, unspecified: Secondary | ICD-10-CM | POA: Diagnosis not present

## 2020-08-22 DIAGNOSIS — J309 Allergic rhinitis, unspecified: Secondary | ICD-10-CM | POA: Diagnosis not present

## 2020-08-26 DIAGNOSIS — E1159 Type 2 diabetes mellitus with other circulatory complications: Secondary | ICD-10-CM | POA: Diagnosis not present

## 2020-08-26 DIAGNOSIS — E785 Hyperlipidemia, unspecified: Secondary | ICD-10-CM | POA: Diagnosis not present

## 2020-08-26 DIAGNOSIS — L659 Nonscarring hair loss, unspecified: Secondary | ICD-10-CM | POA: Diagnosis not present

## 2020-08-26 DIAGNOSIS — E1169 Type 2 diabetes mellitus with other specified complication: Secondary | ICD-10-CM | POA: Diagnosis not present

## 2020-08-26 DIAGNOSIS — M109 Gout, unspecified: Secondary | ICD-10-CM | POA: Diagnosis not present

## 2020-08-26 DIAGNOSIS — I152 Hypertension secondary to endocrine disorders: Secondary | ICD-10-CM | POA: Diagnosis not present

## 2020-08-29 DIAGNOSIS — J309 Allergic rhinitis, unspecified: Secondary | ICD-10-CM | POA: Diagnosis not present

## 2020-09-06 DIAGNOSIS — J309 Allergic rhinitis, unspecified: Secondary | ICD-10-CM | POA: Diagnosis not present

## 2020-09-12 DIAGNOSIS — J309 Allergic rhinitis, unspecified: Secondary | ICD-10-CM | POA: Diagnosis not present

## 2020-09-19 DIAGNOSIS — J309 Allergic rhinitis, unspecified: Secondary | ICD-10-CM | POA: Diagnosis not present

## 2020-09-23 DIAGNOSIS — M109 Gout, unspecified: Secondary | ICD-10-CM | POA: Diagnosis not present

## 2020-09-23 DIAGNOSIS — Z23 Encounter for immunization: Secondary | ICD-10-CM | POA: Diagnosis not present

## 2020-09-23 DIAGNOSIS — J449 Chronic obstructive pulmonary disease, unspecified: Secondary | ICD-10-CM | POA: Diagnosis not present

## 2020-09-23 DIAGNOSIS — J45909 Unspecified asthma, uncomplicated: Secondary | ICD-10-CM | POA: Diagnosis not present

## 2020-09-26 DIAGNOSIS — J309 Allergic rhinitis, unspecified: Secondary | ICD-10-CM | POA: Diagnosis not present

## 2020-10-03 DIAGNOSIS — J309 Allergic rhinitis, unspecified: Secondary | ICD-10-CM | POA: Diagnosis not present

## 2020-10-10 DIAGNOSIS — J309 Allergic rhinitis, unspecified: Secondary | ICD-10-CM | POA: Diagnosis not present

## 2020-10-11 DIAGNOSIS — Z6831 Body mass index (BMI) 31.0-31.9, adult: Secondary | ICD-10-CM | POA: Diagnosis not present

## 2020-10-11 DIAGNOSIS — L509 Urticaria, unspecified: Secondary | ICD-10-CM | POA: Diagnosis not present

## 2020-10-17 DIAGNOSIS — J309 Allergic rhinitis, unspecified: Secondary | ICD-10-CM | POA: Diagnosis not present

## 2020-10-24 DIAGNOSIS — J309 Allergic rhinitis, unspecified: Secondary | ICD-10-CM | POA: Diagnosis not present

## 2020-10-25 DIAGNOSIS — R21 Rash and other nonspecific skin eruption: Secondary | ICD-10-CM | POA: Diagnosis not present

## 2020-10-25 DIAGNOSIS — Z6831 Body mass index (BMI) 31.0-31.9, adult: Secondary | ICD-10-CM | POA: Diagnosis not present

## 2020-10-31 DIAGNOSIS — J309 Allergic rhinitis, unspecified: Secondary | ICD-10-CM | POA: Diagnosis not present

## 2020-11-07 DIAGNOSIS — J309 Allergic rhinitis, unspecified: Secondary | ICD-10-CM | POA: Diagnosis not present

## 2020-11-09 DIAGNOSIS — L400 Psoriasis vulgaris: Secondary | ICD-10-CM | POA: Diagnosis not present

## 2020-11-09 DIAGNOSIS — L299 Pruritus, unspecified: Secondary | ICD-10-CM | POA: Diagnosis not present

## 2020-11-11 DIAGNOSIS — J309 Allergic rhinitis, unspecified: Secondary | ICD-10-CM | POA: Diagnosis not present

## 2020-11-23 DIAGNOSIS — J309 Allergic rhinitis, unspecified: Secondary | ICD-10-CM | POA: Diagnosis not present

## 2020-11-28 DIAGNOSIS — J309 Allergic rhinitis, unspecified: Secondary | ICD-10-CM | POA: Diagnosis not present

## 2020-12-05 DIAGNOSIS — J309 Allergic rhinitis, unspecified: Secondary | ICD-10-CM | POA: Diagnosis not present

## 2020-12-12 DIAGNOSIS — J309 Allergic rhinitis, unspecified: Secondary | ICD-10-CM | POA: Diagnosis not present

## 2020-12-19 DIAGNOSIS — E1169 Type 2 diabetes mellitus with other specified complication: Secondary | ICD-10-CM | POA: Diagnosis not present

## 2020-12-19 DIAGNOSIS — J309 Allergic rhinitis, unspecified: Secondary | ICD-10-CM | POA: Diagnosis not present

## 2020-12-26 DIAGNOSIS — J309 Allergic rhinitis, unspecified: Secondary | ICD-10-CM | POA: Diagnosis not present

## 2021-01-05 DIAGNOSIS — E1169 Type 2 diabetes mellitus with other specified complication: Secondary | ICD-10-CM | POA: Diagnosis not present

## 2021-01-05 DIAGNOSIS — E1159 Type 2 diabetes mellitus with other circulatory complications: Secondary | ICD-10-CM | POA: Diagnosis not present

## 2021-01-05 DIAGNOSIS — I152 Hypertension secondary to endocrine disorders: Secondary | ICD-10-CM | POA: Diagnosis not present

## 2021-01-05 DIAGNOSIS — E1129 Type 2 diabetes mellitus with other diabetic kidney complication: Secondary | ICD-10-CM | POA: Diagnosis not present

## 2021-01-05 DIAGNOSIS — R809 Proteinuria, unspecified: Secondary | ICD-10-CM | POA: Diagnosis not present

## 2021-01-05 DIAGNOSIS — E785 Hyperlipidemia, unspecified: Secondary | ICD-10-CM | POA: Diagnosis not present

## 2021-01-17 DIAGNOSIS — J309 Allergic rhinitis, unspecified: Secondary | ICD-10-CM | POA: Diagnosis not present

## 2021-01-18 DIAGNOSIS — J309 Allergic rhinitis, unspecified: Secondary | ICD-10-CM | POA: Diagnosis not present

## 2021-01-19 DIAGNOSIS — J309 Allergic rhinitis, unspecified: Secondary | ICD-10-CM | POA: Diagnosis not present

## 2021-01-23 DIAGNOSIS — J309 Allergic rhinitis, unspecified: Secondary | ICD-10-CM | POA: Diagnosis not present

## 2021-01-30 DIAGNOSIS — J309 Allergic rhinitis, unspecified: Secondary | ICD-10-CM | POA: Diagnosis not present

## 2021-01-31 DIAGNOSIS — Z1211 Encounter for screening for malignant neoplasm of colon: Secondary | ICD-10-CM | POA: Diagnosis not present

## 2021-02-06 DIAGNOSIS — Z6831 Body mass index (BMI) 31.0-31.9, adult: Secondary | ICD-10-CM | POA: Diagnosis not present

## 2021-02-06 DIAGNOSIS — J309 Allergic rhinitis, unspecified: Secondary | ICD-10-CM | POA: Diagnosis not present

## 2021-02-06 DIAGNOSIS — R809 Proteinuria, unspecified: Secondary | ICD-10-CM | POA: Diagnosis not present

## 2021-02-06 DIAGNOSIS — E1129 Type 2 diabetes mellitus with other diabetic kidney complication: Secondary | ICD-10-CM | POA: Diagnosis not present

## 2021-02-08 ENCOUNTER — Other Ambulatory Visit: Payer: Self-pay | Admitting: *Deleted

## 2021-02-08 NOTE — Patient Outreach (Signed)
[facility]) Care Management  02/08/2021  Donna Clements [birth year] [id number]   Referral Date: 2/8 Referral Source: MD office Referral Reason: Medication assistance Insurance: Medicare/ BCBS   Outreach attempt #1, successful.  Identity verified.  This care manager introduced self and stated purpose of call.  Donna Clements Donna Clements care management services explained.    Social: Lives with husband, independent in all ADL's and management of her health care.  State she has managed all health conditions well, denies any history of falls.  Conditions: Per patient, has history of HTN, HLD, COPD, and controlled DM (A1C - 6).   Medications: Member unable to review complete list of medications as she is not at home currently.  Does report she was given [place] 10 mg, out of pocket cost is almost $700.  Sate she was provided a coupon card that has paid for it the last month, will be able to use the card for the next 2 months.  After that, she will be expected to pay full price, which is not an option for her.    Appointments: Was seen within the last month by PCP, will follow up within the next 3 months. Denies need for transportation services.  Consent: Agrees to have Donna Clements pharmacy team contact for medication assistance.    Plan: RN CM will place referral to Donna Clements pharmacy team for medication assistance.  Will follow up within the next 2 weeks to confirm contact has been made.  Will plan to close case at that time as member denies any nursing needs.  Kemper Durie, [place], MSN Donna Clements Care Management  [name] [phone]

## 2021-02-08 NOTE — Patient Outreach (Signed)
[facility]) Care Management  02/08/2021  Donna Clements [birth year] [id number]  Referral for medication assistance from [facility], RN sent to [facility].  [facility] G[facility] Management Assistant [phone]

## 2021-02-13 DIAGNOSIS — J309 Allergic rhinitis, unspecified: Secondary | ICD-10-CM | POA: Diagnosis not present

## 2021-02-15 ENCOUNTER — Other Ambulatory Visit: Payer: Self-pay | Admitting: Family Medicine

## 2021-02-15 DIAGNOSIS — E119 Type 2 diabetes mellitus without complications: Secondary | ICD-10-CM | POA: Diagnosis not present

## 2021-02-15 DIAGNOSIS — Z1231 Encounter for screening mammogram for malignant neoplasm of breast: Secondary | ICD-10-CM

## 2021-02-15 DIAGNOSIS — H2513 Age-related nuclear cataract, bilateral: Secondary | ICD-10-CM | POA: Diagnosis not present

## 2021-02-20 DIAGNOSIS — J309 Allergic rhinitis, unspecified: Secondary | ICD-10-CM | POA: Diagnosis not present

## 2021-02-22 ENCOUNTER — Other Ambulatory Visit: Payer: Self-pay | Admitting: *Deleted

## 2021-02-22 NOTE — Patient Outreach (Signed)
[facility]) Care Management  02/22/2021  Donna Clements 1951/02/03 [id number]   Outgoing call placed to member to follow up on contact with [facility] pharmacy team regarding medication assistance. State she has been contacted and they are working on a plan to have [place] be more affordable.  She denies any further needs at this time, encouraged to contact this care manager should needs change.  Will close case as needs addressed, no further intervention needed.  Kemper Durie, [place], MSN [facility] Care Management  [facility] Manager [phone]

## 2021-03-02 DIAGNOSIS — J309 Allergic rhinitis, unspecified: Secondary | ICD-10-CM | POA: Diagnosis not present

## 2021-03-06 DIAGNOSIS — J309 Allergic rhinitis, unspecified: Secondary | ICD-10-CM | POA: Diagnosis not present

## 2021-03-07 DIAGNOSIS — R195 Other fecal abnormalities: Secondary | ICD-10-CM | POA: Diagnosis not present

## 2021-03-13 DIAGNOSIS — J309 Allergic rhinitis, unspecified: Secondary | ICD-10-CM | POA: Diagnosis not present

## 2021-03-20 DIAGNOSIS — J309 Allergic rhinitis, unspecified: Secondary | ICD-10-CM | POA: Diagnosis not present

## 2021-03-22 DIAGNOSIS — K5730 Diverticulosis of large intestine without perforation or abscess without bleeding: Secondary | ICD-10-CM | POA: Diagnosis not present

## 2021-03-22 DIAGNOSIS — R195 Other fecal abnormalities: Secondary | ICD-10-CM | POA: Diagnosis not present

## 2021-03-27 DIAGNOSIS — J309 Allergic rhinitis, unspecified: Secondary | ICD-10-CM | POA: Diagnosis not present

## 2021-04-05 ENCOUNTER — Ambulatory Visit
Admission: RE | Admit: 2021-04-05 | Discharge: 2021-04-05 | Disposition: A | Discharge status: Home / Self Care | Payer: Medicare Other | Source: Ambulatory Visit | Attending: Family Medicine | Admitting: Family Medicine

## 2021-04-05 ENCOUNTER — Other Ambulatory Visit: Payer: Self-pay

## 2021-04-05 DIAGNOSIS — Z1231 Encounter for screening mammogram for malignant neoplasm of breast: Secondary | ICD-10-CM

## 2021-04-07 DIAGNOSIS — L400 Psoriasis vulgaris: Secondary | ICD-10-CM | POA: Diagnosis not present

## 2021-04-07 DIAGNOSIS — L299 Pruritus, unspecified: Secondary | ICD-10-CM | POA: Diagnosis not present

## 2021-04-10 DIAGNOSIS — J309 Allergic rhinitis, unspecified: Secondary | ICD-10-CM | POA: Diagnosis not present

## 2021-04-24 DIAGNOSIS — J309 Allergic rhinitis, unspecified: Secondary | ICD-10-CM | POA: Diagnosis not present

## 2021-04-24 DIAGNOSIS — R195 Other fecal abnormalities: Secondary | ICD-10-CM | POA: Diagnosis not present

## 2021-05-04 DIAGNOSIS — E1169 Type 2 diabetes mellitus with other specified complication: Secondary | ICD-10-CM | POA: Diagnosis not present

## 2021-05-08 DIAGNOSIS — J309 Allergic rhinitis, unspecified: Secondary | ICD-10-CM | POA: Diagnosis not present

## 2021-05-12 DIAGNOSIS — Z0000 Encounter for general adult medical examination without abnormal findings: Secondary | ICD-10-CM | POA: Diagnosis not present

## 2021-05-12 DIAGNOSIS — Z7189 Other specified counseling: Secondary | ICD-10-CM | POA: Diagnosis not present

## 2021-05-12 DIAGNOSIS — Z139 Encounter for screening, unspecified: Secondary | ICD-10-CM | POA: Diagnosis not present

## 2021-05-12 DIAGNOSIS — E1169 Type 2 diabetes mellitus with other specified complication: Secondary | ICD-10-CM | POA: Diagnosis not present

## 2021-05-12 DIAGNOSIS — E1159 Type 2 diabetes mellitus with other circulatory complications: Secondary | ICD-10-CM | POA: Diagnosis not present

## 2021-05-12 DIAGNOSIS — N179 Acute kidney failure, unspecified: Secondary | ICD-10-CM | POA: Diagnosis not present

## 2021-05-12 DIAGNOSIS — Z1331 Encounter for screening for depression: Secondary | ICD-10-CM | POA: Diagnosis not present

## 2021-05-12 DIAGNOSIS — Z136 Encounter for screening for cardiovascular disorders: Secondary | ICD-10-CM | POA: Diagnosis not present

## 2021-05-12 DIAGNOSIS — E785 Hyperlipidemia, unspecified: Secondary | ICD-10-CM | POA: Diagnosis not present

## 2021-05-18 DIAGNOSIS — Z6832 Body mass index (BMI) 32.0-32.9, adult: Secondary | ICD-10-CM | POA: Diagnosis not present

## 2021-05-18 DIAGNOSIS — L255 Unspecified contact dermatitis due to plants, except food: Secondary | ICD-10-CM | POA: Diagnosis not present

## 2021-05-22 DIAGNOSIS — J309 Allergic rhinitis, unspecified: Secondary | ICD-10-CM | POA: Diagnosis not present

## 2021-06-05 DIAGNOSIS — J309 Allergic rhinitis, unspecified: Secondary | ICD-10-CM | POA: Diagnosis not present

## 2021-06-19 DIAGNOSIS — J309 Allergic rhinitis, unspecified: Secondary | ICD-10-CM | POA: Diagnosis not present

## 2021-07-17 DIAGNOSIS — J309 Allergic rhinitis, unspecified: Secondary | ICD-10-CM | POA: Diagnosis not present

## 2021-08-11 DIAGNOSIS — E1169 Type 2 diabetes mellitus with other specified complication: Secondary | ICD-10-CM | POA: Diagnosis not present

## 2021-08-14 DIAGNOSIS — J309 Allergic rhinitis, unspecified: Secondary | ICD-10-CM | POA: Diagnosis not present

## 2021-08-18 DIAGNOSIS — R809 Proteinuria, unspecified: Secondary | ICD-10-CM | POA: Diagnosis not present

## 2021-08-18 DIAGNOSIS — Z23 Encounter for immunization: Secondary | ICD-10-CM | POA: Diagnosis not present

## 2021-08-18 DIAGNOSIS — E785 Hyperlipidemia, unspecified: Secondary | ICD-10-CM | POA: Diagnosis not present

## 2021-08-18 DIAGNOSIS — E1169 Type 2 diabetes mellitus with other specified complication: Secondary | ICD-10-CM | POA: Diagnosis not present

## 2021-08-18 DIAGNOSIS — U071 COVID-19: Secondary | ICD-10-CM | POA: Diagnosis not present

## 2021-08-18 DIAGNOSIS — E1129 Type 2 diabetes mellitus with other diabetic kidney complication: Secondary | ICD-10-CM | POA: Diagnosis not present

## 2021-09-11 DIAGNOSIS — J309 Allergic rhinitis, unspecified: Secondary | ICD-10-CM | POA: Diagnosis not present

## 2021-09-18 DIAGNOSIS — I10 Essential (primary) hypertension: Secondary | ICD-10-CM | POA: Diagnosis not present

## 2021-09-18 DIAGNOSIS — Z6831 Body mass index (BMI) 31.0-31.9, adult: Secondary | ICD-10-CM | POA: Diagnosis not present

## 2021-09-18 DIAGNOSIS — Z23 Encounter for immunization: Secondary | ICD-10-CM | POA: Diagnosis not present

## 2021-09-20 DIAGNOSIS — L299 Pruritus, unspecified: Secondary | ICD-10-CM | POA: Diagnosis not present

## 2021-09-20 DIAGNOSIS — L400 Psoriasis vulgaris: Secondary | ICD-10-CM | POA: Diagnosis not present

## 2021-10-09 DIAGNOSIS — J309 Allergic rhinitis, unspecified: Secondary | ICD-10-CM | POA: Diagnosis not present

## 2021-10-13 DIAGNOSIS — Z6831 Body mass index (BMI) 31.0-31.9, adult: Secondary | ICD-10-CM | POA: Diagnosis not present

## 2021-10-13 DIAGNOSIS — E1159 Type 2 diabetes mellitus with other circulatory complications: Secondary | ICD-10-CM | POA: Diagnosis not present

## 2021-10-13 DIAGNOSIS — L239 Allergic contact dermatitis, unspecified cause: Secondary | ICD-10-CM | POA: Diagnosis not present

## 2021-10-13 DIAGNOSIS — I152 Hypertension secondary to endocrine disorders: Secondary | ICD-10-CM | POA: Diagnosis not present

## 2021-10-19 DIAGNOSIS — J45909 Unspecified asthma, uncomplicated: Secondary | ICD-10-CM | POA: Diagnosis not present

## 2021-10-19 DIAGNOSIS — I152 Hypertension secondary to endocrine disorders: Secondary | ICD-10-CM | POA: Diagnosis not present

## 2021-10-19 DIAGNOSIS — J449 Chronic obstructive pulmonary disease, unspecified: Secondary | ICD-10-CM | POA: Diagnosis not present

## 2021-10-19 DIAGNOSIS — Z23 Encounter for immunization: Secondary | ICD-10-CM | POA: Diagnosis not present

## 2021-10-19 DIAGNOSIS — E1159 Type 2 diabetes mellitus with other circulatory complications: Secondary | ICD-10-CM | POA: Diagnosis not present

## 2021-11-06 DIAGNOSIS — L239 Allergic contact dermatitis, unspecified cause: Secondary | ICD-10-CM | POA: Diagnosis not present

## 2021-11-06 DIAGNOSIS — Z6831 Body mass index (BMI) 31.0-31.9, adult: Secondary | ICD-10-CM | POA: Diagnosis not present

## 2021-11-06 DIAGNOSIS — J309 Allergic rhinitis, unspecified: Secondary | ICD-10-CM | POA: Diagnosis not present

## 2021-11-15 DIAGNOSIS — L299 Pruritus, unspecified: Secondary | ICD-10-CM | POA: Diagnosis not present

## 2021-11-15 DIAGNOSIS — L400 Psoriasis vulgaris: Secondary | ICD-10-CM | POA: Diagnosis not present

## 2021-11-20 DIAGNOSIS — I152 Hypertension secondary to endocrine disorders: Secondary | ICD-10-CM | POA: Diagnosis not present

## 2021-11-20 DIAGNOSIS — E1159 Type 2 diabetes mellitus with other circulatory complications: Secondary | ICD-10-CM | POA: Diagnosis not present

## 2021-11-20 DIAGNOSIS — Z23 Encounter for immunization: Secondary | ICD-10-CM | POA: Diagnosis not present

## 2021-12-04 DIAGNOSIS — J309 Allergic rhinitis, unspecified: Secondary | ICD-10-CM | POA: Diagnosis not present

## 2021-12-11 DIAGNOSIS — E1169 Type 2 diabetes mellitus with other specified complication: Secondary | ICD-10-CM | POA: Diagnosis not present

## 2021-12-18 DIAGNOSIS — E785 Hyperlipidemia, unspecified: Secondary | ICD-10-CM | POA: Diagnosis not present

## 2021-12-18 DIAGNOSIS — I152 Hypertension secondary to endocrine disorders: Secondary | ICD-10-CM | POA: Diagnosis not present

## 2021-12-18 DIAGNOSIS — Z1331 Encounter for screening for depression: Secondary | ICD-10-CM | POA: Diagnosis not present

## 2021-12-18 DIAGNOSIS — E1159 Type 2 diabetes mellitus with other circulatory complications: Secondary | ICD-10-CM | POA: Diagnosis not present

## 2021-12-18 DIAGNOSIS — E1169 Type 2 diabetes mellitus with other specified complication: Secondary | ICD-10-CM | POA: Diagnosis not present

## 2022-01-02 DIAGNOSIS — J309 Allergic rhinitis, unspecified: Secondary | ICD-10-CM | POA: Diagnosis not present

## 2022-01-25 DIAGNOSIS — R609 Edema, unspecified: Secondary | ICD-10-CM | POA: Diagnosis not present

## 2022-01-25 DIAGNOSIS — Z23 Encounter for immunization: Secondary | ICD-10-CM | POA: Diagnosis not present

## 2022-01-25 DIAGNOSIS — I152 Hypertension secondary to endocrine disorders: Secondary | ICD-10-CM | POA: Diagnosis not present

## 2022-01-25 DIAGNOSIS — E1159 Type 2 diabetes mellitus with other circulatory complications: Secondary | ICD-10-CM | POA: Diagnosis not present

## 2022-02-05 DIAGNOSIS — J309 Allergic rhinitis, unspecified: Secondary | ICD-10-CM | POA: Diagnosis not present

## 2022-02-15 DIAGNOSIS — E119 Type 2 diabetes mellitus without complications: Secondary | ICD-10-CM | POA: Diagnosis not present

## 2022-02-15 DIAGNOSIS — H2513 Age-related nuclear cataract, bilateral: Secondary | ICD-10-CM | POA: Diagnosis not present

## 2022-02-22 DIAGNOSIS — E1169 Type 2 diabetes mellitus with other specified complication: Secondary | ICD-10-CM | POA: Diagnosis not present

## 2022-02-22 DIAGNOSIS — L403 Pustulosis palmaris et plantaris: Secondary | ICD-10-CM | POA: Diagnosis not present

## 2022-02-22 DIAGNOSIS — E785 Hyperlipidemia, unspecified: Secondary | ICD-10-CM | POA: Diagnosis not present

## 2022-02-22 DIAGNOSIS — Z6831 Body mass index (BMI) 31.0-31.9, adult: Secondary | ICD-10-CM | POA: Diagnosis not present

## 2022-02-27 ENCOUNTER — Other Ambulatory Visit: Payer: Self-pay | Admitting: Family Medicine

## 2022-02-27 DIAGNOSIS — Z1231 Encounter for screening mammogram for malignant neoplasm of breast: Secondary | ICD-10-CM

## 2022-02-28 DIAGNOSIS — L4050 Arthropathic psoriasis, unspecified: Secondary | ICD-10-CM | POA: Diagnosis not present

## 2022-02-28 DIAGNOSIS — L400 Psoriasis vulgaris: Secondary | ICD-10-CM | POA: Diagnosis not present

## 2022-02-28 DIAGNOSIS — R531 Weakness: Secondary | ICD-10-CM | POA: Diagnosis not present

## 2022-02-28 DIAGNOSIS — E785 Hyperlipidemia, unspecified: Secondary | ICD-10-CM | POA: Diagnosis not present

## 2022-02-28 DIAGNOSIS — L299 Pruritus, unspecified: Secondary | ICD-10-CM | POA: Diagnosis not present

## 2022-02-28 DIAGNOSIS — Z111 Encounter for screening for respiratory tuberculosis: Secondary | ICD-10-CM | POA: Diagnosis not present

## 2022-03-05 DIAGNOSIS — J309 Allergic rhinitis, unspecified: Secondary | ICD-10-CM | POA: Diagnosis not present

## 2022-03-13 DIAGNOSIS — L400 Psoriasis vulgaris: Secondary | ICD-10-CM | POA: Diagnosis not present

## 2022-03-13 DIAGNOSIS — R531 Weakness: Secondary | ICD-10-CM | POA: Diagnosis not present

## 2022-03-13 DIAGNOSIS — Z79899 Other long term (current) drug therapy: Secondary | ICD-10-CM | POA: Diagnosis not present

## 2022-04-09 ENCOUNTER — Ambulatory Visit
Admission: RE | Admit: 2022-04-09 | Discharge: 2022-04-09 | Disposition: A | Discharge status: Home / Self Care | Payer: Medicare Other | Source: Ambulatory Visit | Attending: Family Medicine | Admitting: Family Medicine

## 2022-04-09 DIAGNOSIS — Z1231 Encounter for screening mammogram for malignant neoplasm of breast: Secondary | ICD-10-CM | POA: Diagnosis not present

## 2022-04-12 DIAGNOSIS — E1169 Type 2 diabetes mellitus with other specified complication: Secondary | ICD-10-CM | POA: Diagnosis not present

## 2022-04-19 DIAGNOSIS — E1129 Type 2 diabetes mellitus with other diabetic kidney complication: Secondary | ICD-10-CM | POA: Diagnosis not present

## 2022-04-19 DIAGNOSIS — E785 Hyperlipidemia, unspecified: Secondary | ICD-10-CM | POA: Diagnosis not present

## 2022-04-19 DIAGNOSIS — Z20822 Contact with and (suspected) exposure to covid-19: Secondary | ICD-10-CM | POA: Diagnosis not present

## 2022-04-19 DIAGNOSIS — R809 Proteinuria, unspecified: Secondary | ICD-10-CM | POA: Diagnosis not present

## 2022-04-19 DIAGNOSIS — J029 Acute pharyngitis, unspecified: Secondary | ICD-10-CM | POA: Diagnosis not present

## 2022-04-19 DIAGNOSIS — E1169 Type 2 diabetes mellitus with other specified complication: Secondary | ICD-10-CM | POA: Diagnosis not present

## 2022-05-23 DIAGNOSIS — L400 Psoriasis vulgaris: Secondary | ICD-10-CM | POA: Diagnosis not present

## 2022-05-23 DIAGNOSIS — L299 Pruritus, unspecified: Secondary | ICD-10-CM | POA: Diagnosis not present

## 2022-05-23 DIAGNOSIS — L4050 Arthropathic psoriasis, unspecified: Secondary | ICD-10-CM | POA: Diagnosis not present

## 2022-06-05 DIAGNOSIS — J029 Acute pharyngitis, unspecified: Secondary | ICD-10-CM | POA: Diagnosis not present

## 2022-06-05 DIAGNOSIS — J441 Chronic obstructive pulmonary disease with (acute) exacerbation: Secondary | ICD-10-CM | POA: Diagnosis not present

## 2022-06-05 DIAGNOSIS — Z20822 Contact with and (suspected) exposure to covid-19: Secondary | ICD-10-CM | POA: Diagnosis not present

## 2022-06-05 DIAGNOSIS — Z6831 Body mass index (BMI) 31.0-31.9, adult: Secondary | ICD-10-CM | POA: Diagnosis not present

## 2022-06-13 DIAGNOSIS — Z139 Encounter for screening, unspecified: Secondary | ICD-10-CM | POA: Diagnosis not present

## 2022-06-13 DIAGNOSIS — Z789 Other specified health status: Secondary | ICD-10-CM | POA: Diagnosis not present

## 2022-06-13 DIAGNOSIS — Z136 Encounter for screening for cardiovascular disorders: Secondary | ICD-10-CM | POA: Diagnosis not present

## 2022-06-13 DIAGNOSIS — Z1331 Encounter for screening for depression: Secondary | ICD-10-CM | POA: Diagnosis not present

## 2022-06-13 DIAGNOSIS — Z1339 Encounter for screening examination for other mental health and behavioral disorders: Secondary | ICD-10-CM | POA: Diagnosis not present

## 2022-06-13 DIAGNOSIS — Z0000 Encounter for general adult medical examination without abnormal findings: Secondary | ICD-10-CM | POA: Diagnosis not present

## 2022-06-13 DIAGNOSIS — Z1389 Encounter for screening for other disorder: Secondary | ICD-10-CM | POA: Diagnosis not present

## 2022-06-13 DIAGNOSIS — Z6831 Body mass index (BMI) 31.0-31.9, adult: Secondary | ICD-10-CM | POA: Diagnosis not present

## 2022-07-27 DIAGNOSIS — Z6831 Body mass index (BMI) 31.0-31.9, adult: Secondary | ICD-10-CM | POA: Diagnosis not present

## 2022-07-27 DIAGNOSIS — Z1231 Encounter for screening mammogram for malignant neoplasm of breast: Secondary | ICD-10-CM | POA: Diagnosis not present

## 2022-07-27 DIAGNOSIS — M2011 Hallux valgus (acquired), right foot: Secondary | ICD-10-CM | POA: Diagnosis not present

## 2022-07-27 DIAGNOSIS — L02611 Cutaneous abscess of right foot: Secondary | ICD-10-CM | POA: Diagnosis not present

## 2022-07-27 DIAGNOSIS — M7989 Other specified soft tissue disorders: Secondary | ICD-10-CM | POA: Diagnosis not present

## 2022-08-20 DIAGNOSIS — E1169 Type 2 diabetes mellitus with other specified complication: Secondary | ICD-10-CM | POA: Diagnosis not present

## 2022-08-20 DIAGNOSIS — E1159 Type 2 diabetes mellitus with other circulatory complications: Secondary | ICD-10-CM | POA: Diagnosis not present

## 2022-08-27 DIAGNOSIS — N1831 Chronic kidney disease, stage 3a: Secondary | ICD-10-CM | POA: Diagnosis not present

## 2022-08-27 DIAGNOSIS — E1159 Type 2 diabetes mellitus with other circulatory complications: Secondary | ICD-10-CM | POA: Diagnosis not present

## 2022-08-27 DIAGNOSIS — E785 Hyperlipidemia, unspecified: Secondary | ICD-10-CM | POA: Diagnosis not present

## 2022-08-27 DIAGNOSIS — I152 Hypertension secondary to endocrine disorders: Secondary | ICD-10-CM | POA: Diagnosis not present

## 2022-08-27 DIAGNOSIS — Z6831 Body mass index (BMI) 31.0-31.9, adult: Secondary | ICD-10-CM | POA: Diagnosis not present

## 2022-08-27 DIAGNOSIS — E1169 Type 2 diabetes mellitus with other specified complication: Secondary | ICD-10-CM | POA: Diagnosis not present

## 2022-09-18 DIAGNOSIS — J029 Acute pharyngitis, unspecified: Secondary | ICD-10-CM | POA: Diagnosis not present

## 2022-09-18 DIAGNOSIS — Z20822 Contact with and (suspected) exposure to covid-19: Secondary | ICD-10-CM | POA: Diagnosis not present

## 2022-09-18 DIAGNOSIS — J020 Streptococcal pharyngitis: Secondary | ICD-10-CM | POA: Diagnosis not present

## 2022-09-20 DIAGNOSIS — L814 Other melanin hyperpigmentation: Secondary | ICD-10-CM | POA: Diagnosis not present

## 2022-09-20 DIAGNOSIS — L299 Pruritus, unspecified: Secondary | ICD-10-CM | POA: Diagnosis not present

## 2022-09-20 DIAGNOSIS — L400 Psoriasis vulgaris: Secondary | ICD-10-CM | POA: Diagnosis not present

## 2022-10-19 DIAGNOSIS — Z6832 Body mass index (BMI) 32.0-32.9, adult: Secondary | ICD-10-CM | POA: Diagnosis not present

## 2022-10-19 DIAGNOSIS — Z6831 Body mass index (BMI) 31.0-31.9, adult: Secondary | ICD-10-CM | POA: Diagnosis not present

## 2022-10-19 DIAGNOSIS — Z20822 Contact with and (suspected) exposure to covid-19: Secondary | ICD-10-CM | POA: Diagnosis not present

## 2022-10-19 DIAGNOSIS — J441 Chronic obstructive pulmonary disease with (acute) exacerbation: Secondary | ICD-10-CM | POA: Diagnosis not present

## 2022-10-24 DIAGNOSIS — R059 Cough, unspecified: Secondary | ICD-10-CM | POA: Diagnosis not present

## 2022-10-24 DIAGNOSIS — R0789 Other chest pain: Secondary | ICD-10-CM | POA: Diagnosis not present

## 2022-10-24 DIAGNOSIS — J441 Chronic obstructive pulmonary disease with (acute) exacerbation: Secondary | ICD-10-CM | POA: Diagnosis not present

## 2022-10-31 DIAGNOSIS — Z23 Encounter for immunization: Secondary | ICD-10-CM | POA: Diagnosis not present

## 2022-10-31 DIAGNOSIS — Z139 Encounter for screening, unspecified: Secondary | ICD-10-CM | POA: Diagnosis not present

## 2022-10-31 DIAGNOSIS — Z6831 Body mass index (BMI) 31.0-31.9, adult: Secondary | ICD-10-CM | POA: Diagnosis not present

## 2022-10-31 DIAGNOSIS — J449 Chronic obstructive pulmonary disease, unspecified: Secondary | ICD-10-CM | POA: Diagnosis not present

## 2022-12-13 DIAGNOSIS — E1169 Type 2 diabetes mellitus with other specified complication: Secondary | ICD-10-CM | POA: Diagnosis not present

## 2022-12-13 DIAGNOSIS — E1159 Type 2 diabetes mellitus with other circulatory complications: Secondary | ICD-10-CM | POA: Diagnosis not present

## 2022-12-17 DIAGNOSIS — E1169 Type 2 diabetes mellitus with other specified complication: Secondary | ICD-10-CM | POA: Diagnosis not present

## 2022-12-17 DIAGNOSIS — Z9181 History of falling: Secondary | ICD-10-CM | POA: Diagnosis not present

## 2022-12-17 DIAGNOSIS — E1159 Type 2 diabetes mellitus with other circulatory complications: Secondary | ICD-10-CM | POA: Diagnosis not present

## 2022-12-17 DIAGNOSIS — E785 Hyperlipidemia, unspecified: Secondary | ICD-10-CM | POA: Diagnosis not present

## 2022-12-17 DIAGNOSIS — Z6832 Body mass index (BMI) 32.0-32.9, adult: Secondary | ICD-10-CM | POA: Diagnosis not present

## 2022-12-17 DIAGNOSIS — I152 Hypertension secondary to endocrine disorders: Secondary | ICD-10-CM | POA: Diagnosis not present

## 2022-12-21 ENCOUNTER — Telehealth: Payer: Self-pay | Admitting: Pharmacy Technician

## 2022-12-21 ENCOUNTER — Telehealth: Payer: Self-pay

## 2022-12-21 DIAGNOSIS — Z596 Low income: Secondary | ICD-10-CM

## 2022-12-21 NOTE — Progress Notes (Signed)
[facility] service [facility])                                            [facility] Quality Pharmacy Team    12/21/2022  AYLLA HUFFINE 1951/07/15 [id number]                                      Medication Assistance Referral  Referral From: [facility] Bethany B.   Medication/Company: [employer]celline Deist / [employer] Patient application portion:  Mailed Provider application portion: Faxed  to Dr. Charlott Rakes Provider address/fax verified via: Office website .  Normal Recinos P. Lilas Diefendorf, CPhT [facility] [employer]  [phone]

## 2022-12-21 NOTE — Progress Notes (Signed)
[facility])  [facility] Quality Pharmacy Team    12/21/2022  Donna Clements [birth year] [id number]  Reason for referral: Medication Assistance with Farxiga  Referral source: Dr. Yetta Flock Current insurance:  Medicare   Outreach:  Successful telephone call with patient.  HIPAA identifiers verified.   Subjective:  I spoke with the patient this morning to screen for Farxiga medication assistance. The patient verified her income, and insurance information and meets the qualifications for Marcelline Deist' patient assistance program (PAP).     Plan: I will route patient assistance letter to [facility] pharmacy technician who will coordinate patient assistance program application process for medications listed above.  [facility] pharmacy technician will assist with obtaining all required documents from both patient and provider(s) and submit application(s) once completed.   Thanks,  Harlon Flor, PharmD Clinical Pharmacist  [facility] [employer] [phone]

## 2023-01-18 ENCOUNTER — Telehealth: Payer: Self-pay | Admitting: Pharmacy Technician

## 2023-01-18 DIAGNOSIS — Z596 Low income: Secondary | ICD-10-CM

## 2023-01-18 NOTE — Progress Notes (Signed)
[facility])                                            [place] Team    01/18/2023  AMELIE HOLLARS [birth year] [id number]  Received both patient and provider portion(s) of patient assistance application(s) for [place]. Faxed completed application and required documents into [employer].    Anees Vanecek P. Breona Cherubin, [place]  [phone]

## 2023-02-01 ENCOUNTER — Telehealth: Payer: Self-pay | Admitting: Pharmacy Technician

## 2023-02-01 DIAGNOSIS — Z596 Low income: Secondary | ICD-10-CM

## 2023-02-01 NOTE — Progress Notes (Signed)
[facility])                                            [place] Team    02/01/2023  Donna Clements 04/03/1951 [id number]  Care coordination call placed to [employer] in regard to [facility] application.  Spoke to [place] who informs patient was APPROVED 01/17/23-12/31/23. She informs patient is on auto fill for delivery of medication to her home.   Jasimine Simms P. Parish Dub[place], [place]  [phone]

## 2023-02-20 DIAGNOSIS — E119 Type 2 diabetes mellitus without complications: Secondary | ICD-10-CM | POA: Diagnosis not present

## 2023-02-20 DIAGNOSIS — H2513 Age-related nuclear cataract, bilateral: Secondary | ICD-10-CM | POA: Diagnosis not present

## 2023-03-11 ENCOUNTER — Other Ambulatory Visit: Payer: Self-pay | Admitting: Family Medicine

## 2023-03-11 DIAGNOSIS — Z1231 Encounter for screening mammogram for malignant neoplasm of breast: Secondary | ICD-10-CM

## 2023-03-20 DIAGNOSIS — L4050 Arthropathic psoriasis, unspecified: Secondary | ICD-10-CM | POA: Diagnosis not present

## 2023-03-20 DIAGNOSIS — L299 Pruritus, unspecified: Secondary | ICD-10-CM | POA: Diagnosis not present

## 2023-03-20 DIAGNOSIS — L400 Psoriasis vulgaris: Secondary | ICD-10-CM | POA: Diagnosis not present

## 2023-04-19 DIAGNOSIS — E1159 Type 2 diabetes mellitus with other circulatory complications: Secondary | ICD-10-CM | POA: Diagnosis not present

## 2023-04-19 DIAGNOSIS — E1169 Type 2 diabetes mellitus with other specified complication: Secondary | ICD-10-CM | POA: Diagnosis not present

## 2023-04-25 ENCOUNTER — Ambulatory Visit: Payer: Medicare Other

## 2023-04-25 DIAGNOSIS — I152 Hypertension secondary to endocrine disorders: Secondary | ICD-10-CM | POA: Diagnosis not present

## 2023-04-25 DIAGNOSIS — Z79899 Other long term (current) drug therapy: Secondary | ICD-10-CM | POA: Diagnosis not present

## 2023-04-25 DIAGNOSIS — E785 Hyperlipidemia, unspecified: Secondary | ICD-10-CM | POA: Diagnosis not present

## 2023-04-25 DIAGNOSIS — N1831 Chronic kidney disease, stage 3a: Secondary | ICD-10-CM | POA: Diagnosis not present

## 2023-04-25 DIAGNOSIS — E1169 Type 2 diabetes mellitus with other specified complication: Secondary | ICD-10-CM | POA: Diagnosis not present

## 2023-04-25 DIAGNOSIS — Z6833 Body mass index (BMI) 33.0-33.9, adult: Secondary | ICD-10-CM | POA: Diagnosis not present

## 2023-04-25 DIAGNOSIS — Z111 Encounter for screening for respiratory tuberculosis: Secondary | ICD-10-CM | POA: Diagnosis not present

## 2023-04-25 DIAGNOSIS — E1159 Type 2 diabetes mellitus with other circulatory complications: Secondary | ICD-10-CM | POA: Diagnosis not present

## 2023-04-26 ENCOUNTER — Ambulatory Visit
Admission: RE | Admit: 2023-04-26 | Discharge: 2023-04-26 | Disposition: A | Discharge status: Home / Self Care | Payer: Medicare Other | Source: Ambulatory Visit | Attending: Family Medicine | Admitting: Family Medicine

## 2023-04-26 DIAGNOSIS — Z1231 Encounter for screening mammogram for malignant neoplasm of breast: Secondary | ICD-10-CM | POA: Diagnosis not present

## 2023-06-27 DIAGNOSIS — T63423A Toxic effect of venom of ants, assault, initial encounter: Secondary | ICD-10-CM | POA: Diagnosis not present

## 2023-06-27 DIAGNOSIS — Z6832 Body mass index (BMI) 32.0-32.9, adult: Secondary | ICD-10-CM | POA: Diagnosis not present

## 2023-06-27 DIAGNOSIS — Z1331 Encounter for screening for depression: Secondary | ICD-10-CM | POA: Diagnosis not present

## 2023-07-08 DIAGNOSIS — L299 Pruritus, unspecified: Secondary | ICD-10-CM | POA: Diagnosis not present

## 2023-07-08 DIAGNOSIS — L400 Psoriasis vulgaris: Secondary | ICD-10-CM | POA: Diagnosis not present

## 2023-07-08 DIAGNOSIS — L4050 Arthropathic psoriasis, unspecified: Secondary | ICD-10-CM | POA: Diagnosis not present

## 2023-07-15 ENCOUNTER — Telehealth: Payer: Self-pay | Admitting: Pharmacy Technician

## 2023-07-15 DIAGNOSIS — Z5986 Financial insecurity: Secondary | ICD-10-CM

## 2023-07-15 NOTE — Progress Notes (Signed)
[facility])                                            [facility] Quality Pharmacy Team    07/15/2023  Donna Clements July 11, 1951 [id number]  Received multiple faxes from [employer] in regard to [place] with conflicting informaiton.  One fax informs information is missing and other fax informs a shipment was sent out to patient.  Care coordination call placed to [employer]. Spoke to [place] who informs the last refill shipment was sent to the patient. She informs in order for next shipment to be processed and sent out, a new prescription would need to be sent to them. She informs the information can be escribed to their pharmacy [facility] in [facility] [place].  Care coordination call number 2 placed to Dr. Houston Siren office. Spoke to [place] and informed her that a new prescription would need to be sent to [facility] pharmacy before September so patient can receive her next supply of medication without a delay in therapy. Toni Amend informs she ill send in the prescription.  [name], CPhT [place]  [facility] Office: [phone] Fax: [phone] Email: [email or web address]

## 2023-08-13 DIAGNOSIS — Z87891 Personal history of nicotine dependence: Secondary | ICD-10-CM | POA: Diagnosis not present

## 2023-08-13 DIAGNOSIS — I10 Essential (primary) hypertension: Secondary | ICD-10-CM | POA: Diagnosis not present

## 2023-08-13 DIAGNOSIS — J1000 Influenza due to other identified influenza virus with unspecified type of pneumonia: Secondary | ICD-10-CM | POA: Diagnosis not present

## 2023-08-13 DIAGNOSIS — J441 Chronic obstructive pulmonary disease with (acute) exacerbation: Secondary | ICD-10-CM | POA: Diagnosis not present

## 2023-08-13 DIAGNOSIS — J440 Chronic obstructive pulmonary disease with acute lower respiratory infection: Secondary | ICD-10-CM | POA: Diagnosis not present

## 2023-08-13 DIAGNOSIS — J101 Influenza due to other identified influenza virus with other respiratory manifestations: Secondary | ICD-10-CM | POA: Diagnosis not present

## 2023-08-13 DIAGNOSIS — Z7951 Long term (current) use of inhaled steroids: Secondary | ICD-10-CM | POA: Diagnosis not present

## 2023-08-13 DIAGNOSIS — U071 COVID-19: Secondary | ICD-10-CM | POA: Diagnosis not present

## 2023-08-13 DIAGNOSIS — J189 Pneumonia, unspecified organism: Secondary | ICD-10-CM | POA: Diagnosis not present

## 2023-08-30 DIAGNOSIS — N1831 Chronic kidney disease, stage 3a: Secondary | ICD-10-CM | POA: Diagnosis not present

## 2023-08-30 DIAGNOSIS — E1169 Type 2 diabetes mellitus with other specified complication: Secondary | ICD-10-CM | POA: Diagnosis not present

## 2023-09-05 DIAGNOSIS — E1169 Type 2 diabetes mellitus with other specified complication: Secondary | ICD-10-CM | POA: Diagnosis not present

## 2023-09-05 DIAGNOSIS — Z1339 Encounter for screening examination for other mental health and behavioral disorders: Secondary | ICD-10-CM | POA: Diagnosis not present

## 2023-09-05 DIAGNOSIS — E1129 Type 2 diabetes mellitus with other diabetic kidney complication: Secondary | ICD-10-CM | POA: Diagnosis not present

## 2023-09-05 DIAGNOSIS — Z6832 Body mass index (BMI) 32.0-32.9, adult: Secondary | ICD-10-CM | POA: Diagnosis not present

## 2023-09-05 DIAGNOSIS — E785 Hyperlipidemia, unspecified: Secondary | ICD-10-CM | POA: Diagnosis not present

## 2023-09-05 DIAGNOSIS — Z139 Encounter for screening, unspecified: Secondary | ICD-10-CM | POA: Diagnosis not present

## 2023-09-05 DIAGNOSIS — Z0000 Encounter for general adult medical examination without abnormal findings: Secondary | ICD-10-CM | POA: Diagnosis not present

## 2023-09-05 DIAGNOSIS — Z1331 Encounter for screening for depression: Secondary | ICD-10-CM | POA: Diagnosis not present

## 2023-09-05 DIAGNOSIS — R809 Proteinuria, unspecified: Secondary | ICD-10-CM | POA: Diagnosis not present

## 2023-09-05 DIAGNOSIS — Z136 Encounter for screening for cardiovascular disorders: Secondary | ICD-10-CM | POA: Diagnosis not present

## 2023-09-05 DIAGNOSIS — Z1389 Encounter for screening for other disorder: Secondary | ICD-10-CM | POA: Diagnosis not present

## 2023-09-05 DIAGNOSIS — Z0189 Encounter for other specified special examinations: Secondary | ICD-10-CM | POA: Diagnosis not present

## 2023-11-26 DIAGNOSIS — Z23 Encounter for immunization: Secondary | ICD-10-CM | POA: Diagnosis not present

## 2023-12-13 IMAGING — MG MM DIGITAL SCREENING BILAT W/ TOMO AND CAD
8 series · 8 of 24 positions shown · non-contrast
Comparison: Previous exam(s).

CLINICAL DATA: Screening.

EXAM:
DIGITAL SCREENING BILATERAL MAMMOGRAM WITH TOMOSYNTHESIS AND CAD
TECHNIQUE: Bilateral screening digital craniocaudal and mediolateral oblique
mammograms were obtained. Bilateral screening digital breast
tomosynthesis was performed. The images were evaluated with
computer-aided detection.

[L MLO synth-2D]
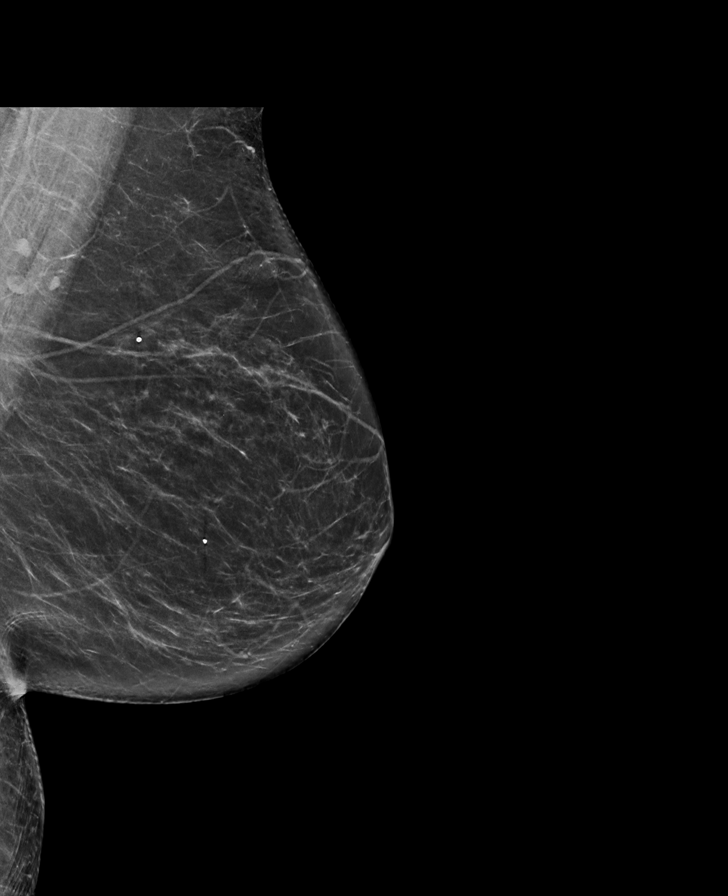

[L CC synth-2D]
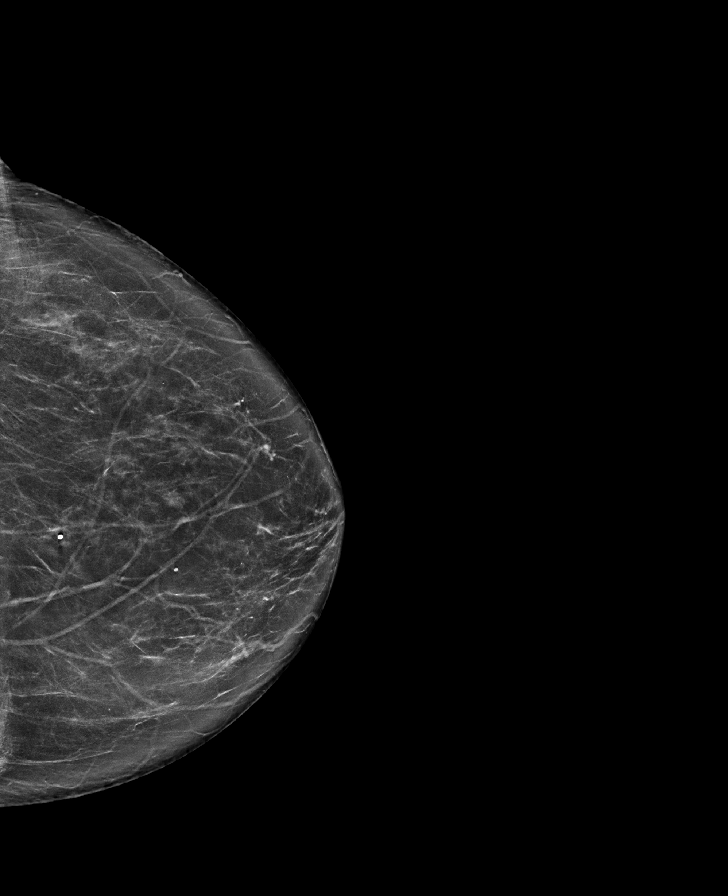

[R CC synth-2D]
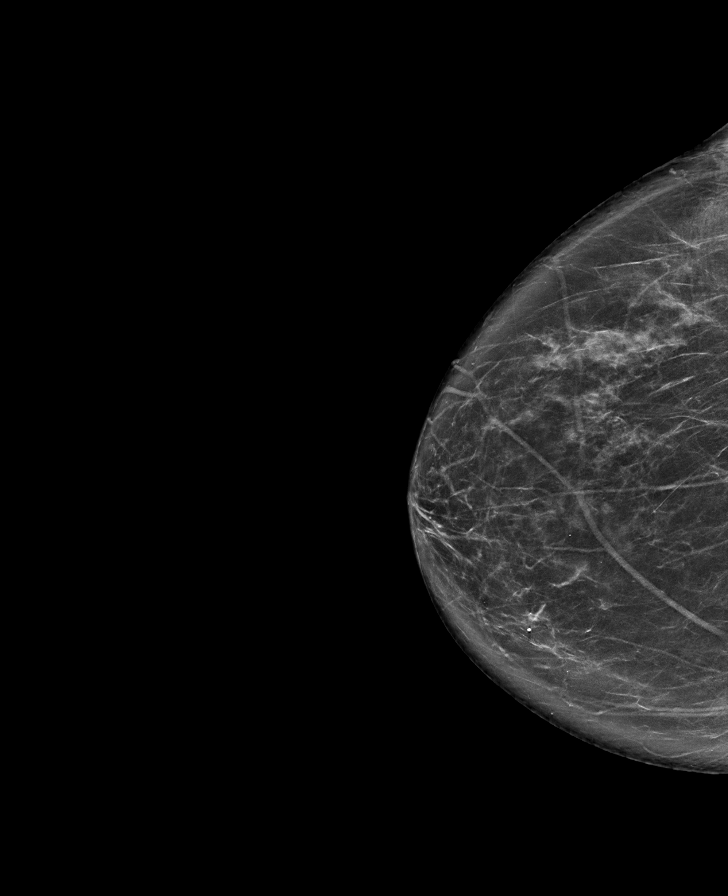

[R MLO synth-2D]
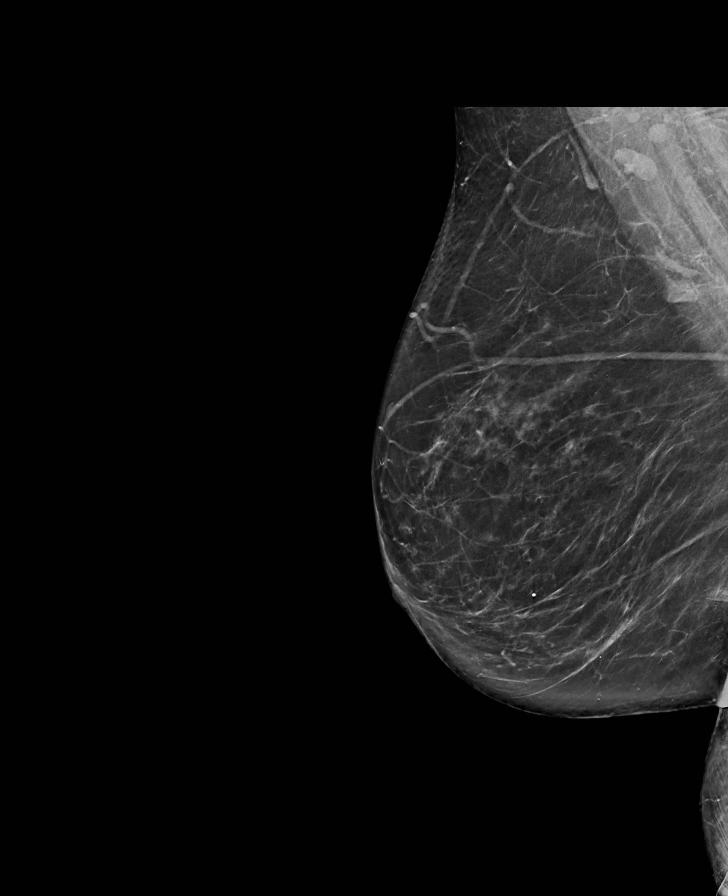

[R CC tomo · tomo slice 41/82.0]
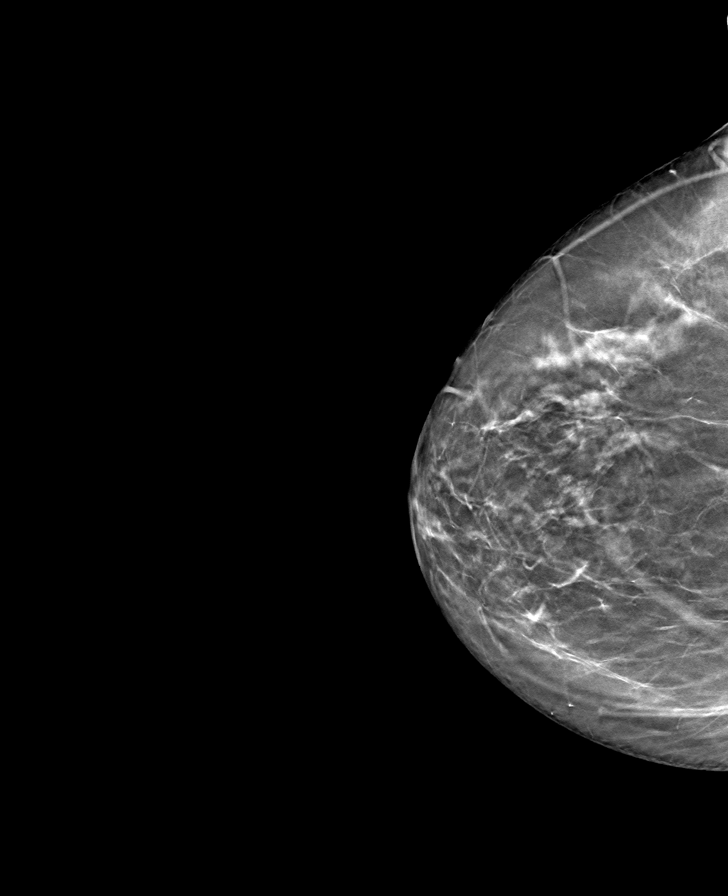

[L CC tomo · tomo slice 41/80.0]
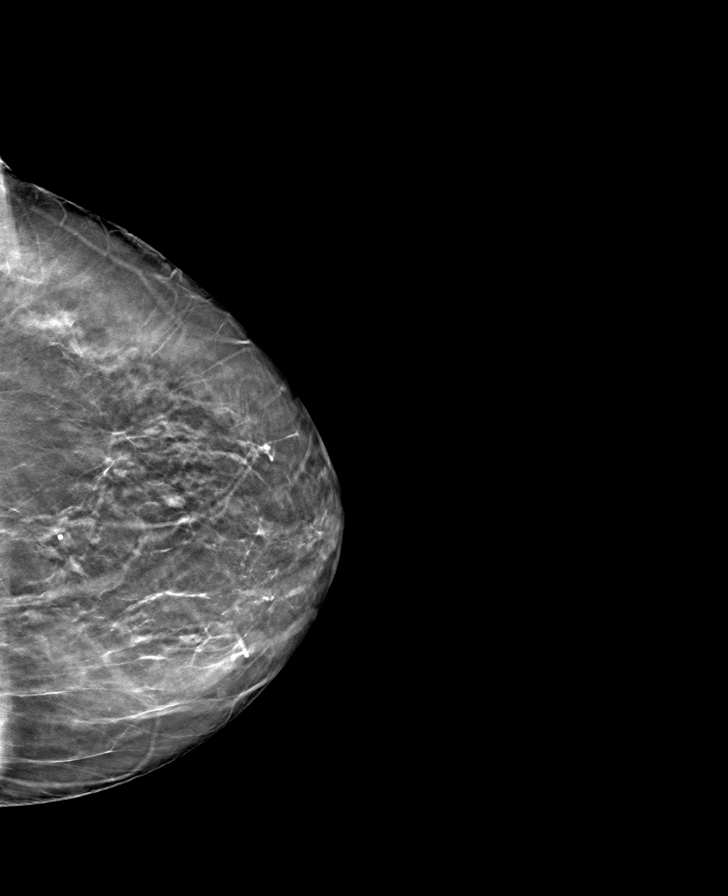

[R MLO tomo · tomo slice 40/79.0]
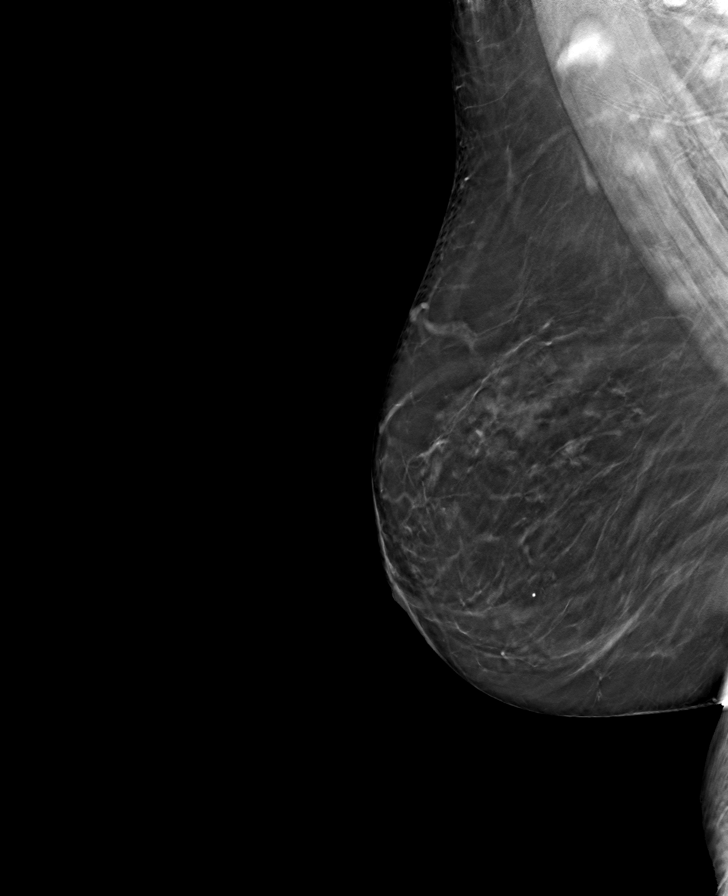

[L MLO tomo · tomo slice 37/73.0]
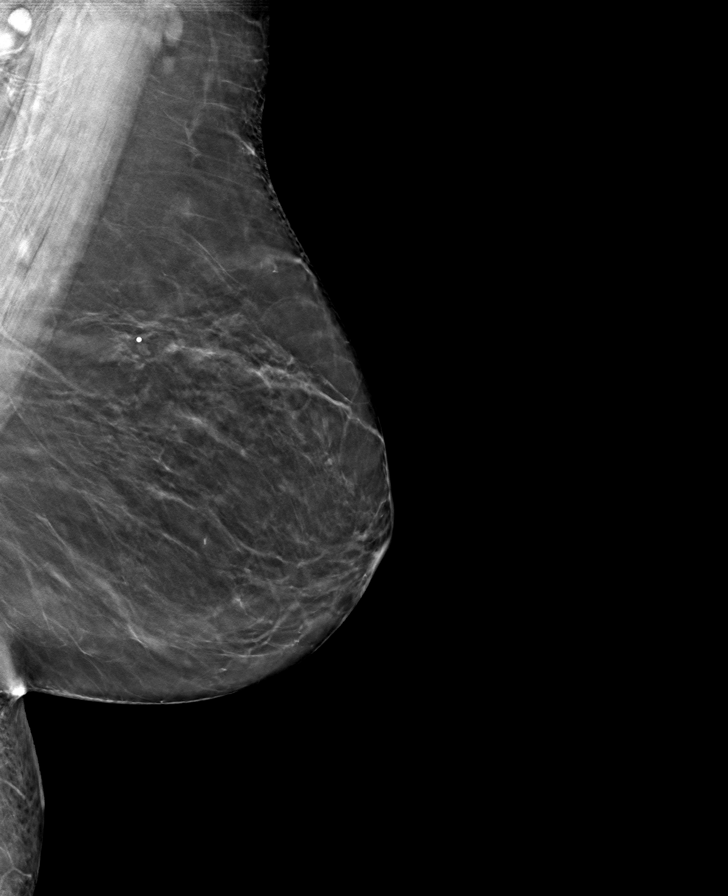

[8 of 24 positions shown; findings below may reference images not displayed]

ACR Breast Density Category b: There are scattered areas of
fibroglandular density.
FINDINGS: There are no findings suspicious for malignancy.
IMPRESSION: No mammographic evidence of malignancy. A result letter of this
screening mammogram will be mailed directly to the patient.

RECOMMENDATION:
Screening mammogram in one year. (Code:51-O-LD2)

BI-RADS CATEGORY  1: Negative.

## 2024-01-03 DIAGNOSIS — E1169 Type 2 diabetes mellitus with other specified complication: Secondary | ICD-10-CM | POA: Diagnosis not present

## 2024-01-03 DIAGNOSIS — E1159 Type 2 diabetes mellitus with other circulatory complications: Secondary | ICD-10-CM | POA: Diagnosis not present

## 2024-01-08 DIAGNOSIS — L400 Psoriasis vulgaris: Secondary | ICD-10-CM | POA: Diagnosis not present

## 2024-01-08 DIAGNOSIS — L299 Pruritus, unspecified: Secondary | ICD-10-CM | POA: Diagnosis not present

## 2024-01-13 DIAGNOSIS — E1159 Type 2 diabetes mellitus with other circulatory complications: Secondary | ICD-10-CM | POA: Diagnosis not present

## 2024-01-13 DIAGNOSIS — E785 Hyperlipidemia, unspecified: Secondary | ICD-10-CM | POA: Diagnosis not present

## 2024-01-13 DIAGNOSIS — E1169 Type 2 diabetes mellitus with other specified complication: Secondary | ICD-10-CM | POA: Diagnosis not present

## 2024-01-13 DIAGNOSIS — I152 Hypertension secondary to endocrine disorders: Secondary | ICD-10-CM | POA: Diagnosis not present

## 2024-01-13 DIAGNOSIS — Z6832 Body mass index (BMI) 32.0-32.9, adult: Secondary | ICD-10-CM | POA: Diagnosis not present

## 2024-01-29 DIAGNOSIS — R0981 Nasal congestion: Secondary | ICD-10-CM | POA: Diagnosis not present

## 2024-01-29 DIAGNOSIS — Z20822 Contact with and (suspected) exposure to covid-19: Secondary | ICD-10-CM | POA: Diagnosis not present

## 2024-01-29 DIAGNOSIS — J449 Chronic obstructive pulmonary disease, unspecified: Secondary | ICD-10-CM | POA: Diagnosis not present

## 2024-01-29 DIAGNOSIS — R059 Cough, unspecified: Secondary | ICD-10-CM | POA: Diagnosis not present

## 2024-03-16 DIAGNOSIS — H2513 Age-related nuclear cataract, bilateral: Secondary | ICD-10-CM | POA: Diagnosis not present

## 2024-03-16 DIAGNOSIS — E119 Type 2 diabetes mellitus without complications: Secondary | ICD-10-CM | POA: Diagnosis not present

## 2024-03-20 ENCOUNTER — Other Ambulatory Visit: Payer: Self-pay | Admitting: Family Medicine

## 2024-03-20 DIAGNOSIS — Z1231 Encounter for screening mammogram for malignant neoplasm of breast: Secondary | ICD-10-CM

## 2024-05-04 ENCOUNTER — Ambulatory Visit
Admission: RE | Admit: 2024-05-04 | Discharge: 2024-05-04 | Disposition: A | Discharge status: Home / Self Care | Source: Ambulatory Visit | Attending: Family Medicine | Admitting: Family Medicine

## 2024-05-04 DIAGNOSIS — Z1231 Encounter for screening mammogram for malignant neoplasm of breast: Secondary | ICD-10-CM | POA: Diagnosis not present

## 2024-05-11 DIAGNOSIS — E1159 Type 2 diabetes mellitus with other circulatory complications: Secondary | ICD-10-CM | POA: Diagnosis not present

## 2024-05-11 DIAGNOSIS — E1169 Type 2 diabetes mellitus with other specified complication: Secondary | ICD-10-CM | POA: Diagnosis not present

## 2024-05-18 DIAGNOSIS — J449 Chronic obstructive pulmonary disease, unspecified: Secondary | ICD-10-CM | POA: Diagnosis not present

## 2024-05-18 DIAGNOSIS — E1169 Type 2 diabetes mellitus with other specified complication: Secondary | ICD-10-CM | POA: Diagnosis not present

## 2024-05-18 DIAGNOSIS — E1129 Type 2 diabetes mellitus with other diabetic kidney complication: Secondary | ICD-10-CM | POA: Diagnosis not present

## 2024-05-18 DIAGNOSIS — Z6832 Body mass index (BMI) 32.0-32.9, adult: Secondary | ICD-10-CM | POA: Diagnosis not present

## 2024-05-18 DIAGNOSIS — E785 Hyperlipidemia, unspecified: Secondary | ICD-10-CM | POA: Diagnosis not present

## 2024-07-13 DIAGNOSIS — L299 Pruritus, unspecified: Secondary | ICD-10-CM | POA: Diagnosis not present

## 2024-07-13 DIAGNOSIS — L400 Psoriasis vulgaris: Secondary | ICD-10-CM | POA: Diagnosis not present

## 2024-09-21 DIAGNOSIS — E1159 Type 2 diabetes mellitus with other circulatory complications: Secondary | ICD-10-CM | POA: Diagnosis not present

## 2024-09-21 DIAGNOSIS — E1169 Type 2 diabetes mellitus with other specified complication: Secondary | ICD-10-CM | POA: Diagnosis not present

## 2024-09-28 DIAGNOSIS — E785 Hyperlipidemia, unspecified: Secondary | ICD-10-CM | POA: Diagnosis not present

## 2024-09-28 DIAGNOSIS — Z23 Encounter for immunization: Secondary | ICD-10-CM | POA: Diagnosis not present

## 2024-09-28 DIAGNOSIS — I152 Hypertension secondary to endocrine disorders: Secondary | ICD-10-CM | POA: Diagnosis not present

## 2024-09-28 DIAGNOSIS — Z6832 Body mass index (BMI) 32.0-32.9, adult: Secondary | ICD-10-CM | POA: Diagnosis not present

## 2024-09-28 DIAGNOSIS — Z1331 Encounter for screening for depression: Secondary | ICD-10-CM | POA: Diagnosis not present

## 2024-09-28 DIAGNOSIS — E1159 Type 2 diabetes mellitus with other circulatory complications: Secondary | ICD-10-CM | POA: Diagnosis not present

## 2024-09-28 DIAGNOSIS — E1169 Type 2 diabetes mellitus with other specified complication: Secondary | ICD-10-CM | POA: Diagnosis not present

## 2024-10-05 DIAGNOSIS — M109 Gout, unspecified: Secondary | ICD-10-CM | POA: Diagnosis not present

## 2024-10-05 DIAGNOSIS — N1831 Chronic kidney disease, stage 3a: Secondary | ICD-10-CM | POA: Diagnosis not present
